# Patient Record
Sex: Female | Born: 1965
Health system: Southern US, Community
[De-identification: ages and names within clinical notes are randomized; demographics above are authoritative.]

## PROBLEM LIST (undated history)

## (undated) DIAGNOSIS — M51379 Other intervertebral disc degeneration, lumbosacral region without mention of lumbar back pain or lower extremity pain: Secondary | ICD-10-CM

## (undated) DIAGNOSIS — M1990 Unspecified osteoarthritis, unspecified site: Secondary | ICD-10-CM

## (undated) DIAGNOSIS — M5134 Other intervertebral disc degeneration, thoracic region: Secondary | ICD-10-CM

## (undated) DIAGNOSIS — M5137 Other intervertebral disc degeneration, lumbosacral region: Secondary | ICD-10-CM

## (undated) DIAGNOSIS — M549 Dorsalgia, unspecified: Secondary | ICD-10-CM

## (undated) DIAGNOSIS — C801 Malignant (primary) neoplasm, unspecified: Secondary | ICD-10-CM

## (undated) DIAGNOSIS — M5135 Other intervertebral disc degeneration, thoracolumbar region: Secondary | ICD-10-CM

## (undated) HISTORY — PX: TUBAL LIGATION: SHX77

## (undated) HISTORY — PX: CHOLECYSTECTOMY: SHX55

## (undated) HISTORY — PX: ABLATION: SHX5711

---

## 2013-07-08 DIAGNOSIS — R109 Unspecified abdominal pain: Secondary | ICD-10-CM | POA: Insufficient documentation

## 2013-07-12 DIAGNOSIS — K8020 Calculus of gallbladder without cholecystitis without obstruction: Secondary | ICD-10-CM | POA: Insufficient documentation

## 2013-08-06 DIAGNOSIS — F17210 Nicotine dependence, cigarettes, uncomplicated: Secondary | ICD-10-CM | POA: Insufficient documentation

## 2015-01-02 DIAGNOSIS — F4324 Adjustment disorder with disturbance of conduct: Secondary | ICD-10-CM | POA: Insufficient documentation

## 2017-03-28 ENCOUNTER — Encounter (HOSPITAL_BASED_OUTPATIENT_CLINIC_OR_DEPARTMENT_OTHER): Payer: Self-pay

## 2017-03-28 ENCOUNTER — Emergency Department (HOSPITAL_BASED_OUTPATIENT_CLINIC_OR_DEPARTMENT_OTHER)
Admission: EM | Admit: 2017-03-28 | Discharge: 2017-03-29 | Disposition: A | Discharge status: Home / Self Care | Payer: Self-pay | Attending: Emergency Medicine | Admitting: Emergency Medicine

## 2017-03-28 DIAGNOSIS — M549 Dorsalgia, unspecified: Secondary | ICD-10-CM | POA: Insufficient documentation

## 2017-03-28 DIAGNOSIS — Z5321 Procedure and treatment not carried out due to patient leaving prior to being seen by health care provider: Secondary | ICD-10-CM | POA: Insufficient documentation

## 2017-03-28 HISTORY — DX: Other intervertebral disc degeneration, thoracolumbar region: M51.35

## 2017-03-28 HISTORY — DX: Other intervertebral disc degeneration, lumbosacral region without mention of lumbar back pain or lower extremity pain: M51.379

## 2017-03-28 HISTORY — DX: Other intervertebral disc degeneration, lumbosacral region: M51.37

## 2017-03-28 HISTORY — DX: Other intervertebral disc degeneration, thoracic region: M51.34

## 2017-03-28 NOTE — ED Triage Notes (Signed)
C/o chronic pain to "whole back and both hips"-pain worse today-denies new injury-slow gait-grimacing

## 2017-03-29 ENCOUNTER — Emergency Department (HOSPITAL_BASED_OUTPATIENT_CLINIC_OR_DEPARTMENT_OTHER)
Admission: EM | Admit: 2017-03-29 | Discharge: 2017-03-29 | Disposition: A | Discharge status: Home / Self Care | Payer: Self-pay | Attending: Emergency Medicine | Admitting: Emergency Medicine

## 2017-03-29 ENCOUNTER — Encounter (HOSPITAL_BASED_OUTPATIENT_CLINIC_OR_DEPARTMENT_OTHER): Payer: Self-pay | Admitting: Emergency Medicine

## 2017-03-29 DIAGNOSIS — R8279 Other abnormal findings on microbiological examination of urine: Secondary | ICD-10-CM | POA: Insufficient documentation

## 2017-03-29 DIAGNOSIS — F17200 Nicotine dependence, unspecified, uncomplicated: Secondary | ICD-10-CM | POA: Insufficient documentation

## 2017-03-29 DIAGNOSIS — Y9389 Activity, other specified: Secondary | ICD-10-CM | POA: Insufficient documentation

## 2017-03-29 DIAGNOSIS — S39012A Strain of muscle, fascia and tendon of lower back, initial encounter: Secondary | ICD-10-CM | POA: Insufficient documentation

## 2017-03-29 DIAGNOSIS — Y929 Unspecified place or not applicable: Secondary | ICD-10-CM | POA: Insufficient documentation

## 2017-03-29 DIAGNOSIS — Y999 Unspecified external cause status: Secondary | ICD-10-CM | POA: Insufficient documentation

## 2017-03-29 DIAGNOSIS — M5432 Sciatica, left side: Secondary | ICD-10-CM

## 2017-03-29 DIAGNOSIS — X500XXA Overexertion from strenuous movement or load, initial encounter: Secondary | ICD-10-CM | POA: Insufficient documentation

## 2017-03-29 DIAGNOSIS — Z79899 Other long term (current) drug therapy: Secondary | ICD-10-CM | POA: Insufficient documentation

## 2017-03-29 LAB — URINALYSIS, MICROSCOPIC (REFLEX)

## 2017-03-29 LAB — URINALYSIS, ROUTINE W REFLEX MICROSCOPIC
Bilirubin Urine: NEGATIVE
Glucose, UA: NEGATIVE mg/dL
Hgb urine dipstick: NEGATIVE
KETONES UR: NEGATIVE mg/dL
NITRITE: POSITIVE — AB
Protein, ur: NEGATIVE mg/dL
SPECIFIC GRAVITY, URINE: 1.02 (ref 1.005–1.030)
pH: 6 (ref 5.0–8.0)

## 2017-03-29 MED ORDER — IBUPROFEN 800 MG PO TABS: 800.0000 mg | ORAL_TABLET | Freq: Three times a day (TID) | ORAL | 0 refills | Status: AC

## 2017-03-29 MED ORDER — KETOROLAC TROMETHAMINE 30 MG/ML IJ SOLN
30.0000 mg | Freq: Once | INTRAMUSCULAR | Status: AC
  Administered 2017-03-29: 30 mg via INTRAMUSCULAR
  Filled 2017-03-29: qty 1

## 2017-03-29 MED ORDER — CYCLOBENZAPRINE HCL 10 MG PO TABS: 10.0000 mg | ORAL_TABLET | Freq: Three times a day (TID) | ORAL | 0 refills | Status: DC | PRN

## 2017-03-29 NOTE — ED Triage Notes (Signed)
Patient states that  She is having pain to her left hip and down her left leg. Has a hx of DDD - reports that she is taking Motrin currently for the pain

## 2017-03-29 NOTE — ED Provider Notes (Signed)
[facility] Provider [facility]   CSN: [id number] Arrival date & time: 03/29/17  1552     History   Chief Complaint Chief Complaint  Patient presents with  . Back Pain    HPI Erika Lopez is a 51 y.o. female.  51 year old female with past medical history including thoracolumbar degenerative disc disease who presents with back and left leg pain. 4 days ago, the patient was with an elderly friend who started to fall and she tried to catch her. Since then, she has been having left-sided low back pain with radiation of the pain down her left leg. She has a history of low back pain but has been doing well recently. She states that she takes Black seed oil to prevent inflammation. Her pain is currently severe and worse with movement. She denies any leg weakness or numbness, saddle anesthesia, or bowel/bladder incontinence. No fevers, vomiting, or recent illness. No urinary symptoms. She took 2 doses of Motrin yesterday and one dose this morning. She denies any IV drug use.   The history is provided by the patient.  Back Pain      Past Medical History:  Diagnosis Date  . DDD (degenerative disc disease), lumbosacral   . DDD (degenerative disc disease), thoracic   . DDD (degenerative disc disease), thoracolumbar     There are no active problems to display for this patient.   Past Surgical History:  Procedure Laterality Date  . CHOLECYSTECTOMY      OB History    No data available       Home Medications    Prior to Admission medications   Medication Sig Start Date End Date Taking? Authorizing Provider  cyclobenzaprine (FLEXERIL) 10 MG tablet Take 1 tablet (10 mg total) by mouth 3 (three) times daily as needed for muscle spasms. 03/29/17   Little, Ambrose Finland, MD  ibuprofen (ADVIL,MOTRIN) 800 MG tablet Take 1 tablet (800 mg total) by mouth 3 (three) times daily. 03/29/17 04/02/17  Little, Ambrose Finland, MD    Family History History reviewed. No pertinent family  history.  Social History Social History  Substance Use Topics  . Smoking status: Current Every Day Smoker  . Smokeless tobacco: Never Used  . Alcohol use Yes     Comment: occ     Allergies   Patient has no known allergies.   Review of Systems Review of Systems  Musculoskeletal: Positive for back pain.   All other systems reviewed and are negative except that which was mentioned in HPI   Physical Exam Updated Vital Signs BP (!) 143/74 (BP Location: Right Arm)   Pulse 79   Temp 98.4 F (36.9 C) (Oral)   Resp 18   Ht 5\' 9"  (1.753 m)   Wt 84.4 kg (186 lb)   SpO2 100%   BMI 27.47 kg/m   Physical Exam  Constitutional: She is oriented to person, place, and time. She appears well-developed and well-nourished. No distress.  Uncomfortable  HENT:  Head: Normocephalic and atraumatic.  Moist mucous membranes  Eyes: Pupils are equal, round, and reactive to light. Conjunctivae are normal.  Neck: Neck supple.  Cardiovascular: Normal rate, regular rhythm and normal heart sounds.   No murmur heard. Pulmonary/Chest: Effort normal and breath sounds normal.  Abdominal: Soft. Bowel sounds are normal. She exhibits no distension. There is no tenderness.  Musculoskeletal: She exhibits no edema.  Pain with left straight leg raise  Neurological: She is alert and oriented to person, place, and time. She displays  normal reflexes. No sensory deficit. She exhibits normal muscle tone.  Fluent speech 5/5 strength x all 4 ext  Skin: Skin is warm and dry.  Psychiatric: She has a normal mood and affect. Judgment normal.  Nursing note and vitals reviewed.    ED Treatments / Results  Labs (all labs ordered are listed, but only abnormal results are displayed) Labs Reviewed  URINALYSIS, ROUTINE W REFLEX MICROSCOPIC - Abnormal; Notable for the following:       Result Value   APPearance CLOUDY (*)    Nitrite POSITIVE (*)    Leukocytes, UA MODERATE (*)    All other components within normal  limits  URINALYSIS, MICROSCOPIC (REFLEX) - Abnormal; Notable for the following:    Bacteria, UA MANY (*)    Squamous Epithelial / LPF 6-30 (*)    All other components within normal limits  URINE CULTURE    EKG  EKG Interpretation None       Radiology No results found.  Procedures Procedures (including critical care time)  Medications Ordered in ED Medications  ketorolac (TORADOL) 30 MG/ML injection 30 mg (30 mg Intramuscular Given 03/29/17 1721)     Initial Impression / Assessment and Plan / ED Course  I have reviewed the triage vital signs and the nursing notes.  Pertinent labs that were available during my care of the patient were reviewed by me and considered in my medical decision making (see chart for details).     Pt w/ LBP radiating down L leg since straining her back while catching a friend several days ago. No infectious symptoms and reassuring vital signs. No weakness or numbness on exam. A UA was sent from triage that contained bacteria, was nitrite positive. The patient states that her urine is always abnormal when she takes black seed oil and she denies any urinary symptoms whatsoever. She preferred sending culture but not treating w/ abx as she has no symptoms and no h/o diabetes or complicated UTI. I have discussed supportive measures including NSAIDs, muscle relaxants, heat therapy, and early range of motion. She has no signs or symptoms of cauda equina, no infectious symptoms to suggest discitis, and no neurologic deficits to warrant any imaging at this time. I have extensively reviewed return precautions with her. Provided with sports medicine follow-up if her symptoms do not improve with conservative measures. Gave IM Toradol prior to discharge and patient discharged in satisfactory condition.  Final Clinical Impressions(s) / ED Diagnoses   Final diagnoses:  Strain of lumbar region, initial encounter  Sciatica of left side    New Prescriptions Discharge  Medication List as of 03/29/2017  5:12 PM    START taking these medications   Details  cyclobenzaprine (FLEXERIL) 10 MG tablet Take 1 tablet (10 mg total) by mouth 3 (three) times daily as needed for muscle spasms., Starting Wed 03/29/2017, Print    ibuprofen (ADVIL,MOTRIN) 800 MG tablet Take 1 tablet (800 mg total) by mouth 3 (three) times daily., Starting Wed 03/29/2017, Until Sun 04/02/2017, Print         Little, Ambrose Finlandachel Morgan, MD 03/29/17 [phone]

## 2017-04-01 LAB — URINE CULTURE
Culture: >=100000 — AB
SPECIAL REQUESTS: NORMAL

## 2017-04-02 ENCOUNTER — Telehealth: Payer: Self-pay | Admitting: *Deleted

## 2017-04-02 NOTE — Progress Notes (Signed)
ED Antimicrobial Stewardship Positive Culture Follow Up   Erika MochaDeanna Mazurek is an 51 y.o. female who [facility] with a chief complaint of  Chief Complaint  Patient presents with  . Back Pain    Recent Results (from the past 720 hour(s))  Urine culture     Status: Abnormal   Collection Time: 03/29/17  4:15 PM  Result Value Ref Range Status   Specimen Description URINE, CLEAN CATCH  Final   Special Requests Normal  Final   Culture >=100,000 COLONIES/mL CITROBACTER KOSERI (A)  Final   Report Status 04/01/2017 FINAL  Final   Organism ID, Bacteria CITROBACTER KOSERI (A)  Final      Susceptibility   Citrobacter koseri - MIC*    CEFAZOLIN <=4 SENSITIVE Sensitive     CEFTRIAXONE <=1 SENSITIVE Sensitive     CIPROFLOXACIN <=0.25 SENSITIVE Sensitive     GENTAMICIN <=1 SENSITIVE Sensitive     IMIPENEM <=0.25 SENSITIVE Sensitive     NITROFURANTOIN 32 SENSITIVE Sensitive     TRIMETH/SULFA <=20 SENSITIVE Sensitive     PIP/TAZO <=4 SENSITIVE Sensitive     * >=100,000 COLONIES/mL CITROBACTER KOSERI    []  Treated with N/A, organism resistant to prescribed antimicrobial [x]  Patient discharged originally without antimicrobial agent and treatment is now indicated  New antibiotic prescription: IF symptomatic, start cephalexin 500mg  PO BID x 7 days  ED Provider: Michela PitcherMina Fawze, PA   Eletha Culbertson, Drake Leachachel Lynn 04/02/2017, 10:34 AM Clinical Pharmacist Phone# [phone]

## 2017-04-02 NOTE — Telephone Encounter (Signed)
Post ED Visit - Positive Culture Follow-up: Unsuccessful Patient Follow-up  Culture assessed and recommendations reviewed by:  []  Enzo BiNathan Batchelder, Pharm.D. []  Celedonio MiyamotoJeremy Frens, Pharm.D., BCPS AQ-ID []  Garvin FilaMike Maccia, Pharm.D., BCPS []  Georgina PillionElizabeth Martin, [address]Ph[address]D., BCPS []  [place] RanchMinh Pham, [address]Pharm[address]D., BCPS, AAHIVP []  Estella HuskMichelle Turner, Pharm.D., BCPS, AAHIVP []  Lysle Pearlachel Rumbarger, PharmD, BCPS []  Casilda Carlsaylor Stone, PharmD, BCPS []  Pollyann SamplesAndy Johnston, PharmD, BCPS  Positive urine culture, reviewed by Michela PitcherMina Fawze, PA-C  [x]  Patient discharged without antimicrobial prescription and treatment is now indicated if symptomatic []  Organism is resistant to prescribed ED discharge antimicrobial []  Patient with positive blood cultures   Unable to contact patient after 3 attempts, letter will be sent to address on file  Lysle PearlRobertson, Gabrielly Mccrystal Talley 04/02/2017, 11:00 AM

## 2017-06-19 ENCOUNTER — Telehealth: Payer: Self-pay | Admitting: Emergency Medicine

## 2017-06-19 NOTE — Telephone Encounter (Signed)
Lost to followup 

## 2017-09-16 ENCOUNTER — Other Ambulatory Visit: Payer: Self-pay

## 2017-09-16 ENCOUNTER — Encounter (HOSPITAL_BASED_OUTPATIENT_CLINIC_OR_DEPARTMENT_OTHER): Payer: Self-pay | Admitting: *Deleted

## 2017-09-16 DIAGNOSIS — K0889 Other specified disorders of teeth and supporting structures: Secondary | ICD-10-CM | POA: Insufficient documentation

## 2017-09-16 DIAGNOSIS — Z5321 Procedure and treatment not carried out due to patient leaving prior to being seen by health care provider: Secondary | ICD-10-CM | POA: Insufficient documentation

## 2017-09-16 NOTE — ED Triage Notes (Signed)
Right upper dental pain x 1 day 

## 2017-09-17 ENCOUNTER — Emergency Department (HOSPITAL_BASED_OUTPATIENT_CLINIC_OR_DEPARTMENT_OTHER)
Admission: EM | Admit: 2017-09-17 | Discharge: 2017-09-17 | Disposition: A | Discharge status: Home / Self Care | Payer: Self-pay | Attending: Emergency Medicine | Admitting: Emergency Medicine

## 2017-09-17 NOTE — ED Notes (Signed)
Registration reports pt seen leaving department

## 2017-09-18 ENCOUNTER — Other Ambulatory Visit: Payer: Self-pay

## 2017-09-18 ENCOUNTER — Emergency Department (HOSPITAL_BASED_OUTPATIENT_CLINIC_OR_DEPARTMENT_OTHER)
Admission: EM | Admit: 2017-09-18 | Discharge: 2017-09-18 | Disposition: A | Discharge status: Home / Self Care | Payer: Self-pay | Attending: Physician Assistant | Admitting: Physician Assistant

## 2017-09-18 ENCOUNTER — Encounter (HOSPITAL_BASED_OUTPATIENT_CLINIC_OR_DEPARTMENT_OTHER): Payer: Self-pay | Admitting: *Deleted

## 2017-09-18 DIAGNOSIS — Z9049 Acquired absence of other specified parts of digestive tract: Secondary | ICD-10-CM | POA: Insufficient documentation

## 2017-09-18 DIAGNOSIS — K047 Periapical abscess without sinus: Secondary | ICD-10-CM | POA: Insufficient documentation

## 2017-09-18 DIAGNOSIS — F17200 Nicotine dependence, unspecified, uncomplicated: Secondary | ICD-10-CM | POA: Insufficient documentation

## 2017-09-18 MED ORDER — CLINDAMYCIN HCL 150 MG PO CAPS
300.0000 mg | ORAL_CAPSULE | Freq: Once | ORAL | Status: AC
  Administered 2017-09-18: 300 mg via ORAL
  Filled 2017-09-18: qty 2

## 2017-09-18 MED ORDER — CLINDAMYCIN HCL 300 MG PO CAPS: 300.0000 mg | ORAL_CAPSULE | Freq: Four times a day (QID) | ORAL | 0 refills | Status: AC

## 2017-09-18 MED FILL — CLINDAMYCIN HCL 300 MG CAPS: 300 | 10 days supply | Qty: 40 | Fill #0

## 2017-09-18 NOTE — ED Notes (Signed)
ED Provider at bedside. 

## 2017-09-18 NOTE — ED Provider Notes (Signed)
[facility] EMERGENCY DEPARTMENT Provider Note   CSN: [id number] Arrival date & time: 09/18/17  1222     History   Chief Complaint Chief Complaint  Patient presents with  . Dental Pain    HPI Erika Lopez is a 52 y.o. female.  HPI   52 year old presenting with dental pain.  Patient says this happened over a couple days.  She has swelling to the right cheek.  Patient had this happen previously.  Patient has multiple dental caries/broken teeth.  Past Medical History:  Diagnosis Date  . DDD (degenerative disc disease), lumbosacral   . DDD (degenerative disc disease), thoracic   . DDD (degenerative disc disease), thoracolumbar     There are no active problems to display for this patient.   Past Surgical History:  Procedure Laterality Date  . CHOLECYSTECTOMY      OB History    No data available       Home Medications    Prior to Admission medications   Medication Sig Start Date End Date Taking? Authorizing Provider  cyclobenzaprine (FLEXERIL) 10 MG tablet Take 1 tablet (10 mg total) by mouth 3 (three) times daily as needed for muscle spasms. 03/29/17   Little, Ambrose Finlandachel Morgan, MD    Family History No family history on file.  Social History Social History   Tobacco Use  . Smoking status: Current Every Day Smoker  . Smokeless tobacco: Never Used  Substance Use Topics  . Alcohol use: Yes    Comment: occ  . Drug use: No     Allergies   Patient has no known allergies.   Review of Systems Review of Systems  Constitutional: Negative for fever.  HENT: Positive for dental problem.      Physical Exam Updated Vital Signs BP 134/80   Pulse 86   Temp 98.3 F (36.8 C) (Oral)   Resp 20   Ht 5\' 9"  (1.753 m)   Wt 83.9 kg (185 lb)   SpO2 99%   BMI 27.32 kg/m   Physical Exam  Constitutional: She is oriented to person, place, and time. She appears well-developed and well-nourished.  HENT:  Head: Normocephalic and atraumatic.  Multiple broken  teeth.  Multiple broken dental caries.  Patient has tenderness swelling to the right upper gum.  Small area nonfluctuant indurated.  Eyes: Right eye exhibits no discharge. Left eye exhibits no discharge.  Cardiovascular: Normal rate and regular rhythm.  No murmur heard. Pulmonary/Chest: Effort normal and breath sounds normal. She has no rales.  Neurological: She is oriented to person, place, and time.  Skin: Skin is warm and dry. She is not diaphoretic.  Psychiatric: She has a normal mood and affect.  Nursing note and vitals reviewed.    ED Treatments / Results  Labs (all labs ordered are listed, but only abnormal results are displayed) Labs Reviewed - No data to display  EKG  EKG Interpretation None       Radiology No results found.  Procedures Procedures (including critical care time)  Medications Ordered in ED Medications  clindamycin (CLEOCIN) capsule 300 mg (not administered)     Initial Impression / Assessment and Plan / ED Course  I have reviewed the triage vital signs and the nursing notes.  Pertinent labs & imaging results that were available during my care of the patient were reviewed by me and considered in my medical decision making (see chart for details).     52 year old here with dental infection.  Will treat with clindamycin.  Gave her dental resources for follow-up.  Do not suspect any kind of larger infection, patient appears nontoxic, no appreciable external swelling on exam.   Final Clinical Impressions(s) / ED Diagnoses   Final diagnoses:  None    ED Discharge Orders    None       Landa Mullinax, Cindee Salt, MD 09/18/17 1423

## 2017-09-18 NOTE — Discharge Instructions (Signed)
Please use this antibiotics and follow-up with a dentist.  We have provided you a list to help you get started.

## 2017-09-18 NOTE — ED Triage Notes (Signed)
Dental pain x 3 days.  

## 2019-09-27 ENCOUNTER — Emergency Department (HOSPITAL_BASED_OUTPATIENT_CLINIC_OR_DEPARTMENT_OTHER): Payer: Self-pay

## 2019-09-27 ENCOUNTER — Other Ambulatory Visit: Payer: Self-pay

## 2019-09-27 ENCOUNTER — Emergency Department (HOSPITAL_BASED_OUTPATIENT_CLINIC_OR_DEPARTMENT_OTHER)
Admission: EM | Admit: 2019-09-27 | Discharge: 2019-09-27 | Disposition: A | Discharge status: Home / Self Care | Payer: Self-pay | Attending: Emergency Medicine | Admitting: Emergency Medicine

## 2019-09-27 ENCOUNTER — Encounter (HOSPITAL_BASED_OUTPATIENT_CLINIC_OR_DEPARTMENT_OTHER): Payer: Self-pay | Admitting: Emergency Medicine

## 2019-09-27 DIAGNOSIS — M62830 Muscle spasm of back: Secondary | ICD-10-CM | POA: Insufficient documentation

## 2019-09-27 DIAGNOSIS — W19XXXA Unspecified fall, initial encounter: Secondary | ICD-10-CM

## 2019-09-27 DIAGNOSIS — M5416 Radiculopathy, lumbar region: Secondary | ICD-10-CM | POA: Insufficient documentation

## 2019-09-27 DIAGNOSIS — F17210 Nicotine dependence, cigarettes, uncomplicated: Secondary | ICD-10-CM | POA: Insufficient documentation

## 2019-09-27 MED ORDER — DIAZEPAM 5 MG PO TABS: 5.0000 mg | ORAL_TABLET | Freq: Two times a day (BID) | ORAL | 0 refills | Status: DC

## 2019-09-27 MED ORDER — HYDROCODONE-ACETAMINOPHEN 5-325 MG PO TABS: 1.0000 | ORAL_TABLET | ORAL | 0 refills | Status: DC | PRN

## 2019-09-27 MED ORDER — DEXAMETHASONE SODIUM PHOSPHATE 10 MG/ML IJ SOLN
10.0000 mg | Freq: Once | INTRAMUSCULAR | Status: AC
  Administered 2019-09-27: 10 mg via INTRAMUSCULAR
  Filled 2019-09-27: qty 1

## 2019-09-27 MED ORDER — KETOROLAC TROMETHAMINE 30 MG/ML IJ SOLN
30.0000 mg | Freq: Once | INTRAMUSCULAR | Status: AC
  Administered 2019-09-27: 08:00:00 30 mg via INTRAMUSCULAR
  Filled 2019-09-27: qty 1

## 2019-09-27 MED ORDER — PREDNISONE 10 MG (21) PO TBPK: ORAL_TABLET | ORAL | 0 refills | Status: DC

## 2019-09-27 MED ORDER — IBUPROFEN 600 MG PO TABS: 600.0000 mg | ORAL_TABLET | Freq: Four times a day (QID) | ORAL | 0 refills | Status: DC | PRN

## 2019-09-27 MED FILL — predniSONE 10 MG TABS: 10 | 12 days supply | Qty: 42 | Fill #0

## 2019-09-27 MED FILL — IBUPROFEN 600 MG TABLET: 600 | 7 days supply | Qty: 30 | Fill #0

## 2019-09-27 MED FILL — HYDROCODON-APAP 5-325: 5-325 | 2 days supply | Qty: 10 | Fill #0

## 2019-09-27 MED FILL — diazePAM 5 MG TABS: 5 | 5 days supply | Qty: 10 | Fill #0

## 2019-09-27 NOTE — ED Notes (Signed)
ED Provider at bedside. 

## 2019-09-27 NOTE — ED Triage Notes (Signed)
Slipped and fell at work 2 days ago.  Pain from upper to lower back on the left mostly.  Pain radiates down into left hip and down left leg. Pt drove to ED.

## 2019-09-27 NOTE — ED Provider Notes (Signed)
[facility] EMERGENCY DEPARTMENT Provider Note   CSN: [id number] Arrival date & time: 09/27/19  3893     History Chief Complaint  Patient presents with  . Fall    Erika Lopez is a 54 y.o. female.  Pt presents to the ED today after a fall at work 2 days ago.  She works at [employer] and slipped on [employer] paper that the "price marked as" stickers are on.  She landed on her bottom and has pain in her back which radiates into her left leg.  She tried to go to work today, but was unable to work.  She was able to drive here.  She has not had any bowel or bladder issues.         Past Medical History:  Diagnosis Date  . DDD (degenerative disc disease), lumbosacral   . DDD (degenerative disc disease), thoracic   . DDD (degenerative disc disease), thoracolumbar     There are no problems to display for this patient.   Past Surgical History:  Procedure Laterality Date  . CHOLECYSTECTOMY       OB History   No obstetric history on file.     No family history on file.  Social History   Tobacco Use  . Smoking status: Current Every Day Smoker    Packs/day: 0.50    Types: Cigarettes  . Smokeless tobacco: Never Used  Substance Use Topics  . Alcohol use: Yes    Comment: occ  . Drug use: No    Home Medications Prior to Admission medications   Medication Sig Start Date End Date Taking? Authorizing Provider  diazepam (VALIUM) 5 MG tablet Take 1 tablet (5 mg total) by mouth 2 (two) times daily. 09/27/19   Jacalyn Lefevre, MD  HYDROcodone-acetaminophen (NORCO/VICODIN) 5-325 MG tablet Take 1 tablet by mouth every 4 (four) hours as needed. 09/27/19   Jacalyn Lefevre, MD  ibuprofen (ADVIL) 600 MG tablet Take 1 tablet (600 mg total) by mouth every 6 (six) hours as needed. 09/27/19   Jacalyn Lefevre, MD  predniSONE (STERAPRED UNI-PAK 21 TAB) 10 MG (21) TBPK tablet Take 6 tabs for 2 days, then 5 for 2 days, then 4 for 2 days, then 3 for 2 days, 2 for 2 days, then 1 for 2 days  09/27/19   Jacalyn Lefevre, MD    Allergies    Patient has no known allergies.  Review of Systems   Review of Systems  Musculoskeletal: Positive for back pain.  All other systems reviewed and are negative.   Physical Exam Updated Vital Signs BP (!) 152/79 (BP Location: Right Arm)   Pulse 91   Resp 16   Ht 5' 8.5" (1.74 m)   Wt 81.1 kg   SpO2 100%   BMI 26.79 kg/m   Physical Exam Vitals and nursing note reviewed.  Constitutional:      Appearance: Normal appearance.  HENT:     Head: Normocephalic and atraumatic.     Right Ear: External ear normal.     Left Ear: External ear normal.     Nose: Nose normal.     Mouth/Throat:     Mouth: Mucous membranes are moist.     Pharynx: Oropharynx is clear.  Eyes:     Extraocular Movements: Extraocular movements intact.     Conjunctiva/sclera: Conjunctivae normal.     Pupils: Pupils are equal, round, and reactive to light.  Cardiovascular:     Rate and Rhythm: Normal rate and regular rhythm.  Pulses: Normal pulses.     Heart sounds: Normal heart sounds.  Pulmonary:     Effort: Pulmonary effort is normal.     Breath sounds: Normal breath sounds.  Abdominal:     General: Abdomen is flat. Bowel sounds are normal.     Palpations: Abdomen is soft.  Musculoskeletal:     Cervical back: Normal range of motion and neck supple.       Back:  Skin:    General: Skin is warm.     Capillary Refill: Capillary refill takes less than 2 seconds.  Neurological:     General: No focal deficit present.     Mental Status: She is alert and oriented to person, place, and time.  Psychiatric:        Mood and Affect: Mood normal.        Behavior: Behavior normal.        Thought Content: Thought content normal.        Judgment: Judgment normal.     ED Results / Procedures / Treatments   Labs (all labs ordered are listed, but only abnormal results are displayed) Labs Reviewed - No data to display  EKG None  Radiology DG Lumbar Spine  Complete  Result Date: 09/27/2019 CLINICAL DATA:  Low back pain and left lower extremity radiculopathy since a fall 2 days ago. EXAM: LUMBAR SPINE - COMPLETE 4+ VIEW COMPARISON:  Radiographs dated 02/10/2015 FINDINGS: There is no fracture or bone destruction. Alignment is normal. Slight narrowing of the L5-S1 disc space. Slight facet arthritis at L4-5 on the right, new since the prior study. Sacroiliac joints appear normal. IMPRESSION: No acute abnormality. Slight degenerative disc disease at L5-S1. New slight facet arthritis at L4-5 on the right. Electronically Signed   By: Lorriane Shire M.D.   On: 09/27/2019 08:43   DG Hip Unilat W or Wo Pelvis 2-3 Views Left  Result Date: 09/27/2019 CLINICAL DATA:  Low back pain and left lower extremity radiculopathy since a fall 2 days ago. EXAM: DG HIP (WITH OR WITHOUT PELVIS) 2-3V LEFT COMPARISON:  None. FINDINGS: There is no free pelvic fracture or left hip fracture. Tiny osteophyte on the left femoral head. No joint space narrowing. Soft tissues are normal. IMPRESSION: Minimal degenerative changes of the left hip.  No acute abnormality. Electronically Signed   By: Lorriane Shire M.D.   On: 09/27/2019 08:45    Procedures Procedures (including critical care time)  Medications Ordered in ED Medications  ketorolac (TORADOL) 30 MG/ML injection 30 mg (30 mg Intramuscular Given 09/27/19 0737)  dexamethasone (DECADRON) injection 10 mg (10 mg Intramuscular Given 09/27/19 0735)    ED Course  I have reviewed the triage vital signs and the nursing notes.  Pertinent labs & imaging results that were available during my care of the patient were reviewed by me and considered in my medical decision making (see chart for details).    MDM Rules/Calculators/A&P                      Pain has improved, but it is still there.  She drove, so she can't have more meds here.  She will be given rx for pain meds and a note for work.  Return if worse. Final Clinical Impression(s) /  ED Diagnoses Final diagnoses:  Lumbar radiculopathy  Lumbar paraspinal muscle spasm  Fall, initial encounter    Rx / DC Orders ED Discharge Orders         Ordered  predniSONE (STERAPRED UNI-PAK 21 TAB) 10 MG (21) TBPK tablet     09/27/19 0825    HYDROcodone-acetaminophen (NORCO/VICODIN) 5-325 MG tablet  Every 4 hours PRN     09/27/19 0825    diazepam (VALIUM) 5 MG tablet  2 times daily     09/27/19 0825    ibuprofen (ADVIL) 600 MG tablet  Every 6 hours PRN     09/27/19 0825           Jacalyn Lefevre, MD 09/27/19 [phone]

## 2019-11-12 ENCOUNTER — Emergency Department (HOSPITAL_BASED_OUTPATIENT_CLINIC_OR_DEPARTMENT_OTHER)
Admission: EM | Admit: 2019-11-12 | Discharge: 2019-11-12 | Disposition: A | Discharge status: Home / Self Care | Payer: Self-pay | Attending: Emergency Medicine | Admitting: Emergency Medicine

## 2019-11-12 ENCOUNTER — Other Ambulatory Visit: Payer: Self-pay

## 2019-11-12 ENCOUNTER — Encounter (HOSPITAL_BASED_OUTPATIENT_CLINIC_OR_DEPARTMENT_OTHER): Payer: Self-pay | Admitting: Emergency Medicine

## 2019-11-12 DIAGNOSIS — M5442 Lumbago with sciatica, left side: Secondary | ICD-10-CM | POA: Insufficient documentation

## 2019-11-12 DIAGNOSIS — G8929 Other chronic pain: Secondary | ICD-10-CM

## 2019-11-12 DIAGNOSIS — F17210 Nicotine dependence, cigarettes, uncomplicated: Secondary | ICD-10-CM | POA: Insufficient documentation

## 2019-11-12 DIAGNOSIS — M545 Low back pain: Secondary | ICD-10-CM | POA: Diagnosis present

## 2019-11-12 MED ORDER — LIDOCAINE 5 % EX PTCH: 1.0000 | MEDICATED_PATCH | CUTANEOUS | 0 refills | Status: DC

## 2019-11-12 MED ORDER — KETOROLAC TROMETHAMINE 30 MG/ML IJ SOLN
30.0000 mg | Freq: Once | INTRAMUSCULAR | Status: AC
  Administered 2019-11-12: 30 mg via INTRAMUSCULAR
  Filled 2019-11-12: qty 1

## 2019-11-12 MED ORDER — MELOXICAM 15 MG PO TABS: 15.0000 mg | ORAL_TABLET | Freq: Every day | ORAL | 0 refills | Status: AC

## 2019-11-12 MED ORDER — KETOROLAC TROMETHAMINE 30 MG/ML IJ SOLN
30.0000 mg | Freq: Once | INTRAMUSCULAR | Status: DC
Start: 2019-11-12 — End: 2019-11-12

## 2019-11-12 MED FILL — LIDOCAINE PATCH 5%: 5 | 30 days supply | Qty: 30 | Fill #0

## 2019-11-12 MED FILL — MELOXICAM 15 MG TABLET: 15 | 15 days supply | Qty: 15 | Fill #0

## 2019-11-12 NOTE — ED Triage Notes (Signed)
Left low back pain that radiates down into left leg.  This was a WC injury that happened in March and is no better.

## 2019-11-12 NOTE — ED Notes (Signed)
Pt discharged to home. Discharge instructions have been discussed with patient and/or family members. Pt verbally acknowledges understanding d/c instructions, and endorses comprehension to checkout at registration before leaving.  °

## 2019-11-12 NOTE — Discharge Instructions (Signed)
Take the medications as prescribed.   Follow up with Neurosurgery outpatient.  Return if you develop bowel or bladder incontinence, numbness to perineal region or fever.

## 2019-11-12 NOTE — ED Provider Notes (Addendum)
[facility] EMERGENCY DEPARTMENT Provider Note   CSN: [id number] Arrival date & time: 11/12/19  1614    History Chief Complaint  Patient presents with  . Back Pain    Erika Lopez is a 54 y.o. 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 Has trip and fall at work on 09/27/19. Was seen here in ED at that time. Had negative xray back given steroids, Norco and toradol. Patient states continued pain to lower back which radiates into her left leg. Pain has not increased nor improved since the injury.  Initially anti-inflammatories and pain medicine helps.  She was given a sitting job at work which helped.  Patient states her employer has put her back to full duty which has increased her pain.  Pain worse when she stands for long periods of times, bends and twist.  No new injury.  No weakness paresthesias.  No IV drug use, bowel or bladder incontinence, saddle paresthesias.  Feels like her "typical" back pain she has had previously.  Denies additional aggravating relieving factors.  No fever, chills, nausea, vomiting, chest pain, shortness of breath, abdominal pain, diarrhea, dysuria.  Has history of chronic constipation which is at its baseline.  Rates her pain a 7/10. Ambulatory without difficulty.  She is requesting Neurosurgery or Ortho referral for her back pain.  History obtained from patient and past medical records.  No interpreter is used.  HPI     Past Medical History:  Diagnosis Date  . DDD (degenerative disc disease), lumbosacral   . DDD (degenerative disc disease), thoracic   . DDD (degenerative disc disease), thoracolumbar     There are no problems to display for this patient.   Past Surgical History:  Procedure Laterality Date  . CHOLECYSTECTOMY       OB History   No obstetric history on file.     No family history on file.  Social History   Tobacco Use  . Smoking status:  Current Every Day Smoker    Packs/day: 0.50    Types: Cigarettes  . Smokeless tobacco: Never Used  Substance Use Topics  . Alcohol use: Yes    Comment: occ  . Drug use: No    Home Medications Prior to Admission medications   Medication Sig Start Date End Date Taking? Authorizing Provider  lidocaine (LIDODERM) 5 % Place 1 patch onto the skin daily. Remove & Discard patch within 12 hours or as directed by MD 11/12/19   Quantavis Obryant A, PA-C  meloxicam (MOBIC) 15 MG tablet Take 1 tablet (15 mg total) by mouth daily for 15 days. 11/12/19 11/27/19  Pasquale Matters A, PA-C    Allergies    Patient has no known allergies.  Review of Systems   Review of Systems  Constitutional: Negative.   HENT: Negative.   Respiratory: Negative.   Cardiovascular: Negative.   Gastrointestinal: Negative.   Genitourinary: Negative.   Musculoskeletal: Positive for back pain. Negative for gait problem.  Skin: Negative.   Neurological: Negative.   All other systems reviewed and are negative.   Physical Exam Updated Vital Signs BP (!) 148/78 (BP Location: Right Arm)   Pulse 87   Temp 98.6 F (37 C) (Oral)   Resp 16   Ht 5\' 8"  (1.727 m)   Wt 82.6 kg   SpO2 99%   BMI 27.69 kg/m   Physical Exam Physical Exam  Constitutional: Pt appears well-developed and well-nourished.  No distress.  HENT:  Head: Normocephalic and atraumatic.  Mouth/Throat: Oropharynx is clear and moist. No oropharyngeal exudate.  Eyes: Conjunctivae are normal.  Neck: Normal range of motion. Neck supple.  Full ROM without pain  Cardiovascular: Normal rate, regular rhythm and intact distal pulses.   Pulmonary/Chest: Effort normal and breath sounds normal. No respiratory distress. Pt has no wheezes.  Abdominal: Soft. Pt exhibits no distension. There is no tenderness, rebound or guarding. No abd bruit or pulsatile mass Musculoskeletal:  Full range of motion of the T-spine and L-spine with flexion, hyperextension, and lateral  flexion. No midline tenderness or stepoffs. No tenderness to palpation of the spinous processes of the T-spine or L-spine. Moderate tenderness to palpation of the paraspinous muscles of the L-spine on LEFT. POsitive straight leg raise on left at 40'. Lymphadenopathy:    Pt has no cervical adenopathy.  Neurological: Pt is alert. Pt has normal reflexes.  Reflex Scores:      Bicep reflexes are 2+ on the right side and 2+ on the left side.      Brachioradialis reflexes are 2+ on the right side and 2+ on the left side.      Patellar reflexes are 2+ on the right side and 2+ on the left side.      Achilles reflexes are 2+ on the right side and 2+ on the left side. Speech is clear and goal oriented, follows commands Normal 5/5 strength in upper and lower extremities bilaterally including dorsiflexion and plantar flexion, strong and equal grip strength Sensation normal to light and sharp touch Moves extremities without ataxia, coordination intact Normal gait Normal balance No Clonus Skin: Skin is warm and dry. No rash noted or lesions noted. Pt is not diaphoretic. No erythema, ecchymosis,edema or warmth.  Psychiatric: Pt has a normal mood and affect. Behavior is normal.  Nursing note and vitals reviewed. ED Results / Procedures / Treatments   Labs (all labs ordered are listed, but only abnormal results are displayed) Labs Reviewed - No data to display  EKG None  Radiology No results found.  Procedures Procedures (including critical care time)  Medications Ordered in ED Medications  ketorolac (TORADOL) 30 MG/ML injection 30 mg (has no administration in time range)   ED Course  I have reviewed the triage vital signs and the nursing notes.  Pertinent labs & imaging results that were available during my care of the patient were reviewed by me and considered in my medical decision making (see chart for details).  54 year old presents for evaluation of back pain after trip and fall  almost 3 months ago. Afebrile, non septic, non ill appearing. Seen here at that time with negative lumbar xray and dc home with symptomatic management. Pain not worse however not improved since fall.  No red flags for back pain, specifically no history IV drug use, bowel or bladder incontinence, saddle paresthesias.  I have low suspicion for cauda equina, discitis, osteomyelitis, transverse myelitis, psoas abscess, burst or compression fracture.  She appears overall well.  Ambulatory without difficulty.  Patient does have history of recurrent lumbar back pain with known history of DDD with radicular symptoms to the left.  Exam seems consistent with her prior visits, per my review.  Do not think she needs emergent MRI or CT imaging at this time.  Will treat with anti-inflammatories, lidocaine patches and have her follow-up with neurosurgery  Give chronic back pain at this point. Do not think she needs narcotic prescription for her  pain currently.  The patient has been appropriately medically screened and/or stabilized in the ED. I have low suspicion for any other emergent medical condition which would require further screening, evaluation or treatment in the ED or require inpatient management.  Patient is hemodynamically stable and in no acute distress.  Patient able to ambulate in department prior to ED.  Evaluation does not show acute pathology that would require ongoing or additional emergent interventions while in the emergency department or further inpatient treatment.  I have discussed the diagnosis with the patient and answered all questions.  Pain is been managed while in the emergency department and patient has no further complaints prior to discharge.  Patient is comfortable with plan discussed in room and is stable for discharge at this time.  I have discussed strict return precautions for returning to the emergency department.  Patient was encouraged to follow-up with PCP/specialist refer to at  discharge.    MDM Rules/Calculators/A&P                      Final Clinical Impression(s) / ED Diagnoses Final diagnoses:  Chronic left-sided low back pain with left-sided sciatica    Rx / DC Orders ED Discharge Orders         Ordered    meloxicam (MOBIC) 15 MG tablet  Daily     11/12/19 1658    lidocaine (LIDODERM) 5 %  Every 24 hours     11/12/19 1658           Quintasia Theroux A, PA-C 11/12/19 1701    Antonela Freiman A, PA-C 11/12/19 1702    Little, Ambrose Finland, MD 11/12/19 1811

## 2019-11-25 DIAGNOSIS — M545 Low back pain, unspecified: Secondary | ICD-10-CM | POA: Insufficient documentation

## 2020-05-19 ENCOUNTER — Emergency Department (HOSPITAL_BASED_OUTPATIENT_CLINIC_OR_DEPARTMENT_OTHER)
Admission: EM | Admit: 2020-05-19 | Discharge: 2020-05-19 | Disposition: A | Discharge status: Home / Self Care | Payer: Self-pay | Attending: Emergency Medicine | Admitting: Emergency Medicine

## 2020-05-19 ENCOUNTER — Other Ambulatory Visit: Payer: Self-pay

## 2020-05-19 ENCOUNTER — Other Ambulatory Visit (HOSPITAL_BASED_OUTPATIENT_CLINIC_OR_DEPARTMENT_OTHER): Payer: Self-pay | Admitting: Emergency Medicine

## 2020-05-19 ENCOUNTER — Encounter (HOSPITAL_BASED_OUTPATIENT_CLINIC_OR_DEPARTMENT_OTHER): Payer: Self-pay | Admitting: Emergency Medicine

## 2020-05-19 DIAGNOSIS — H1032 Unspecified acute conjunctivitis, left eye: Secondary | ICD-10-CM | POA: Insufficient documentation

## 2020-05-19 DIAGNOSIS — F17210 Nicotine dependence, cigarettes, uncomplicated: Secondary | ICD-10-CM | POA: Insufficient documentation

## 2020-05-19 DIAGNOSIS — H109 Unspecified conjunctivitis: Secondary | ICD-10-CM

## 2020-05-19 MED ORDER — ERYTHROMYCIN 5 MG/GM OP OINT: TOPICAL_OINTMENT | OPHTHALMIC | 0 refills | Status: DC

## 2020-05-19 MED FILL — ERYTHROMYCIN EYE OINTMENT: 5 | 7 days supply | Qty: 4 | Fill #0

## 2020-05-19 NOTE — ED Triage Notes (Signed)
Reports left eye redness, pain and discharge since Friday.

## 2020-05-19 NOTE — Discharge Instructions (Signed)
Follow up with a family doc.  Return for change in vision, fever.  You can also follow up with an eye doctor.

## 2020-05-19 NOTE — ED Provider Notes (Signed)
[facility] EMERGENCY DEPARTMENT Provider Note   CSN: [id number] Arrival date & time: 05/19/20  1137     History Chief Complaint  Patient presents with  . Eye Drainage    Erika Lopez is a 54 y.o. female.  54 yo F with a chief complaints of left eye drainage and itching.  This been going on for about 3 to 4 days now.  No known sick contacts no cough congestion or fever.  She has been waking up in the morning with her eyes matted shut.  Having some thick yellowish-greenish discharge.  Denies contact lens use.  Denies trauma or foreign body.  Denies woodworking metal working, metal grinding.  The history is provided by the patient.  Eye Problem Location:  Left eye Quality:  Aching and tearing (itching) Severity:  Moderate Onset quality:  Gradual Duration:  4 days Timing:  Constant Progression:  Unchanged Chronicity:  New Relieved by:  Nothing Worsened by:  Nothing Ineffective treatments:  None tried Associated symptoms: discharge and redness   Associated symptoms: no headaches, no nausea and no vomiting        Past Medical History:  Diagnosis Date  . DDD (degenerative disc disease), lumbosacral   . DDD (degenerative disc disease), thoracic   . DDD (degenerative disc disease), thoracolumbar     There are no problems to display for this patient.   Past Surgical History:  Procedure Laterality Date  . CHOLECYSTECTOMY       OB History   No obstetric history on file.     No family history on file.  Social History   Tobacco Use  . Smoking status: Current Every Day Smoker    Packs/day: 0.50    Types: Cigarettes  . Smokeless tobacco: Never Used  Substance Use Topics  . Alcohol use: Yes    Comment: occ  . Drug use: No    Home Medications Prior to Admission medications   Medication Sig Start Date End Date Taking? Authorizing Provider  erythromycin ophthalmic ointment Place a 1/2 inch ribbon of ointment into the lower eyelid four times a day for  7 days. 05/19/20   Melene Plan, DO  lidocaine (LIDODERM) 5 % Place 1 patch onto the skin daily. Remove & Discard patch within 12 hours or as directed by MD 11/12/19   Henderly, Britni A, PA-C    Allergies    Patient has no known allergies.  Review of Systems   Review of Systems  Constitutional: Negative for chills and fever.  HENT: Negative for congestion and rhinorrhea.   Eyes: Positive for discharge and redness. Negative for visual disturbance.  Respiratory: Negative for shortness of breath and wheezing.   Cardiovascular: Negative for chest pain and palpitations.  Gastrointestinal: Negative for nausea and vomiting.  Genitourinary: Negative for dysuria and urgency.  Musculoskeletal: Negative for arthralgias and myalgias.  Skin: Negative for pallor and wound.  Neurological: Negative for dizziness and headaches.    Physical Exam Updated Vital Signs BP (!) 144/76 (BP Location: Right Arm)   Pulse 90   Temp 97.8 F (36.6 C) (Oral)   Resp 18   Ht 5\' 3"  (1.6 m)   Wt 81.6 kg   SpO2 100%   BMI 31.89 kg/m   Physical Exam Vitals and nursing note reviewed.  Constitutional:      General: She is not in acute distress.    Appearance: She is well-developed. She is not diaphoretic.  HENT:     Head: Normocephalic and atraumatic.  Eyes:  General:        Left eye: Discharge (watery, some purulent to the medial canthus) present.No foreign body.     Conjunctiva/sclera:     Left eye: Left conjunctiva is injected. Chemosis present.  Cardiovascular:     Rate and Rhythm: Normal rate and regular rhythm.     Heart sounds: No murmur heard.  No friction rub. No gallop.   Pulmonary:     Effort: Pulmonary effort is normal.     Breath sounds: No wheezing or rales.  Abdominal:     General: There is no distension.     Palpations: Abdomen is soft.     Tenderness: There is no abdominal tenderness.  Musculoskeletal:        General: No tenderness.     Cervical back: Normal range of motion and  neck supple.  Skin:    General: Skin is warm and dry.  Neurological:     Mental Status: She is alert and oriented to person, place, and time.  Psychiatric:        Behavior: Behavior normal.     ED Results / Procedures / Treatments   Labs (all labs ordered are listed, but only abnormal results are displayed) Labs Reviewed - No data to display  EKG None  Radiology No results found.  Procedures Procedures (including critical care time)  Medications Ordered in ED Medications - No data to display  ED Course  I have reviewed the triage vital signs and the nursing notes.  Pertinent labs & imaging results that were available during my care of the patient were reviewed by me and considered in my medical decision making (see chart for details).    MDM Rules/Calculators/A&P                          54 yo F with a cc of left eye drainage.  Matted shut in the morning.  Clinically with pink eye, no noted FB.  PEARL.  Start on antibiotic ointment, optho follow up.   12:47 PM:  I have discussed the diagnosis/risks/treatment options with the patient and believe the pt to be eligible for discharge home to follow-up with Ophtho. We also discussed returning to the ED immediately if new or worsening sx occur. We discussed the sx which are most concerning (e.g., sudden worsening pain, fever, inability to tolerate by mouth, change in vision) that necessitate immediate return. Medications administered to the patient during their visit and any new prescriptions provided to the patient are listed below.  Medications given during this visit Medications - No data to display   The patient appears reasonably screen and/or stabilized for discharge and I doubt any other medical condition or other [facility] requiring further screening, evaluation, or treatment in the ED at this time prior to discharge.   Final Clinical Impression(s) / ED Diagnoses Final diagnoses:  Bacterial conjunctivitis of left eye     Rx / DC Orders ED Discharge Orders         Ordered    erythromycin ophthalmic ointment        05/19/20 1202           Melene Plan, DO 05/19/20 1247

## 2020-05-31 ENCOUNTER — Emergency Department (HOSPITAL_BASED_OUTPATIENT_CLINIC_OR_DEPARTMENT_OTHER)
Admission: EM | Admit: 2020-05-31 | Discharge: 2020-05-31 | Disposition: A | Discharge status: Home / Self Care | Payer: Self-pay | Attending: Emergency Medicine | Admitting: Emergency Medicine

## 2020-05-31 ENCOUNTER — Encounter (HOSPITAL_BASED_OUTPATIENT_CLINIC_OR_DEPARTMENT_OTHER): Payer: Self-pay | Admitting: *Deleted

## 2020-05-31 ENCOUNTER — Other Ambulatory Visit: Payer: Self-pay

## 2020-05-31 ENCOUNTER — Other Ambulatory Visit (HOSPITAL_BASED_OUTPATIENT_CLINIC_OR_DEPARTMENT_OTHER): Payer: Self-pay | Admitting: Student

## 2020-05-31 DIAGNOSIS — M5442 Lumbago with sciatica, left side: Secondary | ICD-10-CM | POA: Insufficient documentation

## 2020-05-31 DIAGNOSIS — M5136 Other intervertebral disc degeneration, lumbar region: Secondary | ICD-10-CM | POA: Insufficient documentation

## 2020-05-31 DIAGNOSIS — M5432 Sciatica, left side: Secondary | ICD-10-CM

## 2020-05-31 DIAGNOSIS — M5134 Other intervertebral disc degeneration, thoracic region: Secondary | ICD-10-CM | POA: Insufficient documentation

## 2020-05-31 DIAGNOSIS — F17210 Nicotine dependence, cigarettes, uncomplicated: Secondary | ICD-10-CM | POA: Insufficient documentation

## 2020-05-31 DIAGNOSIS — M62830 Muscle spasm of back: Secondary | ICD-10-CM | POA: Insufficient documentation

## 2020-05-31 HISTORY — DX: Dorsalgia, unspecified: M54.9

## 2020-05-31 MED ORDER — KETOROLAC TROMETHAMINE 15 MG/ML IJ SOLN
15.0000 mg | Freq: Once | INTRAMUSCULAR | Status: AC
  Administered 2020-05-31: 15 mg via INTRAMUSCULAR
  Filled 2020-05-31: qty 1

## 2020-05-31 MED ORDER — LIDOCAINE 5 % EX PTCH: 1.0000 | MEDICATED_PATCH | CUTANEOUS | 0 refills | Status: DC

## 2020-05-31 MED ORDER — METHOCARBAMOL 500 MG PO TABS: 500.0000 mg | ORAL_TABLET | Freq: Every evening | ORAL | 0 refills | Status: DC | PRN

## 2020-05-31 MED ORDER — LIDOCAINE 5 % EX PTCH
1.0000 | MEDICATED_PATCH | CUTANEOUS | Status: DC
  Administered 2020-05-31: 1 via TRANSDERMAL
  Filled 2020-05-31: qty 1

## 2020-05-31 MED ORDER — PREDNISONE 10 MG (21) PO TBPK: ORAL_TABLET | Freq: Every day | ORAL | 0 refills | Status: DC

## 2020-05-31 NOTE — Discharge Instructions (Signed)
Take prednisone as prescribed.  Take entire course.  Do not take other anti-inflammatories at the same time open (Advil, Motrin, ibuprofen, naproxen, Aleve). You may supplement with Tylenol if you need further pain control. Use Robaxin as needed for muscle stiffness or soreness. Have caution, as this may make you tired or groggy. Do not drive or operate heavy machinery while taking this medication.  Use lidocaine patch to help with pain. Do gentle stretches to help with pain control. Follow up with your back doctor if pain is not improving with this treatment.  Return to the ER if you develop high fevers, numbness, loss of bowel or bladder control, or any new or concerning symptoms.   

## 2020-05-31 NOTE — ED Triage Notes (Signed)
Pt reports back injury at work in March. States back pain x 3 days, spasms x 1 day

## 2020-05-31 NOTE — ED Provider Notes (Signed)
[facility] EMERGENCY DEPARTMENT Provider Note   CSN: [id number] Arrival date & time: 05/31/20  1923     History Chief Complaint  Patient presents with  . Back Pain    Erika Lopez is a 54 y.o. female presenting for evaluation of back pain.  Patient states the past 3 days, she has had gradually worsening back pain.  It is all over her back, though initially began in her low back.  She states it radiates down her left leg several months ago, but was doing well until recently.  She denies new trauma or injury.  Denies precipitating event.  She denies fevers, chills, nausea, vomiting, Donnell pain, urinary symptoms, loss of bowel bladder control, numbness, history of cancer, history of IVDU.  She reports no other medical problems, takes no medications daily.  When she last had back problems, she followed with Dr. Myra Rude from orthopedics.  HPI     Past Medical History:  Diagnosis Date  . Back pain   . DDD (degenerative disc disease), lumbosacral   . DDD (degenerative disc disease), thoracic   . DDD (degenerative disc disease), thoracolumbar     There are no problems to display for this patient.   Past Surgical History:  Procedure Laterality Date  . CHOLECYSTECTOMY       OB History   No obstetric history on file.     No family history on file.  Social History   Tobacco Use  . Smoking status: Current Every Day Smoker    Packs/day: 0.50    Types: Cigarettes  . Smokeless tobacco: Never Used  Vaping Use  . Vaping Use: Never used  Substance Use Topics  . Alcohol use: Yes    Comment: occ  . Drug use: No    Home Medications Prior to Admission medications   Medication Sig Start Date End Date Taking? Authorizing Provider  erythromycin ophthalmic ointment Place a 1/2 inch ribbon of ointment into the lower eyelid four times a day for 7 days. 05/19/20   Melene Plan, DO  lidocaine (LIDODERM) 5 % Place 1 patch onto the skin daily. Remove & Discard patch within  12 hours or as directed by MD 05/31/20   Donaldo Teegarden, PA-C  methocarbamol (ROBAXIN) 500 MG tablet Take 1 tablet (500 mg total) by mouth at bedtime as needed for muscle spasms. 05/31/20   Tristen Luce, PA-C  predniSONE (STERAPRED UNI-PAK 21 TAB) 10 MG (21) TBPK tablet Take by mouth daily. Take 6 tabs by mouth daily  for 2 days, then 5 tabs for 2 days, then 4 tabs for 2 days, then 3 tabs for 2 days, 2 tabs for 2 days, then 1 tab by mouth daily for 2 days 05/31/20   Sanah Kraska, PA-C    Allergies    Patient has no known allergies.  Review of Systems   Review of Systems  Musculoskeletal: Positive for back pain.  Neurological: Negative for numbness.    Physical Exam Updated Vital Signs BP 116/81 (BP Location: Right Arm)   Pulse 95   Temp 98 F (36.7 C) (Oral)   Resp 18   Ht 5\' 8"  (1.727 m)   Wt 81.6 kg   SpO2 100%   BMI 27.37 kg/m   Physical Exam Vitals and nursing note reviewed.  Constitutional:      General: She is not in acute distress.    Appearance: She is well-developed.     Comments: Laying in the bed in no acute distress  HENT:  Head: Normocephalic and atraumatic.  Eyes:     Conjunctiva/sclera: Conjunctivae normal.     Pupils: Pupils are equal, round, and reactive to light.  Cardiovascular:     Rate and Rhythm: Normal rate and regular rhythm.     Pulses: Normal pulses.  Pulmonary:     Effort: Pulmonary effort is normal. No respiratory distress.     Breath sounds: Normal breath sounds. No wheezing.  Abdominal:     General: There is no distension.     Palpations: Abdomen is soft. There is no mass.     Tenderness: There is no abdominal tenderness. There is no guarding or rebound.  Musculoskeletal:        General: Tenderness present. Normal range of motion.     Cervical back: Normal range of motion and neck supple.     Comments: Diffuse tenderness palpation of the back without focal pain.  There is a knot with worsening pain and swelling of the right  mid back musculature.  No erythema, warmth, or signs of infection.  No contusion or signs of injury.  No saddle paresthesias.  Pedal pulses 2+ bilaterally.  Skin:    General: Skin is warm and dry.     Capillary Refill: Capillary refill takes less than 2 seconds.  Neurological:     Mental Status: She is alert and oriented to person, place, and time.     ED Results / Procedures / Treatments   Labs (all labs ordered are listed, but only abnormal results are displayed) Labs Reviewed - No data to display  EKG None  Radiology No results found.  Procedures Procedures (including critical care time)  Medications Ordered in ED Medications  ketorolac (TORADOL) 15 MG/ML injection 15 mg (has no administration in time range)  lidocaine (LIDODERM) 5 % 1 patch (has no administration in time range)    ED Course  I have reviewed the triage vital signs and the nursing notes.  Pertinent labs & imaging results that were available during my care of the patient were reviewed by me and considered in my medical decision making (see chart for details).    MDM Rules/Calculators/A&P                          Patient presenting for evaluation of low back pain.  Physical exam reassuring, neurovascularly intact.  No red flags for back pain.  Pain is reproducible with palpation of the musculature.  Likely musculoskeletal.  As patient has shooting pain down her left leg, likely also sciatica.  Doubt fracture, I do not believe x-rays will be beneficial.  Doubt vertebral injury, infection, spinal cord compression, myelopathy, or cauda equina syndrome.  Will treat symptomatically with prednisone, muscle relaxers, muscle creams.  Patient to follow-up with orthopedics.  At this time, patient appears safe for discharge.  Return precautions given.  Patient states she understands and agrees to plan.  Final Clinical Impression(s) / ED Diagnoses Final diagnoses:  Back spasm  Sciatica of left side    Rx / DC  Orders ED Discharge Orders         Ordered    predniSONE (STERAPRED UNI-PAK 21 TAB) 10 MG (21) TBPK tablet  Daily        05/31/20 2049    methocarbamol (ROBAXIN) 500 MG tablet  At bedtime PRN        05/31/20 2049    lidocaine (LIDODERM) 5 %  Every 24 hours  05/31/20 2049           Alveria Apley, PA-C 05/31/20 2118    Pricilla Loveless, MD 06/01/20 [phone]

## 2020-06-02 MED FILL — METHOCARBAMOL 500 MG TABS: 500 | 10 days supply | Qty: 10 | Fill #0

## 2020-06-02 MED FILL — predniSONE 10 MG TABS: 10 | 12 days supply | Qty: 42 | Fill #0

## 2020-08-28 IMAGING — DX DG LUMBAR SPINE COMPLETE 4+V
5 series · 5 of 5 positions shown · non-contrast
Comparison: Radiographs dated 02/10/2015

CLINICAL DATA: Low back pain and left lower extremity radiculopathy
since a fall 2 days ago.

EXAM:
LUMBAR SPINE - COMPLETE 4+ VIEW

[l-spine ap]
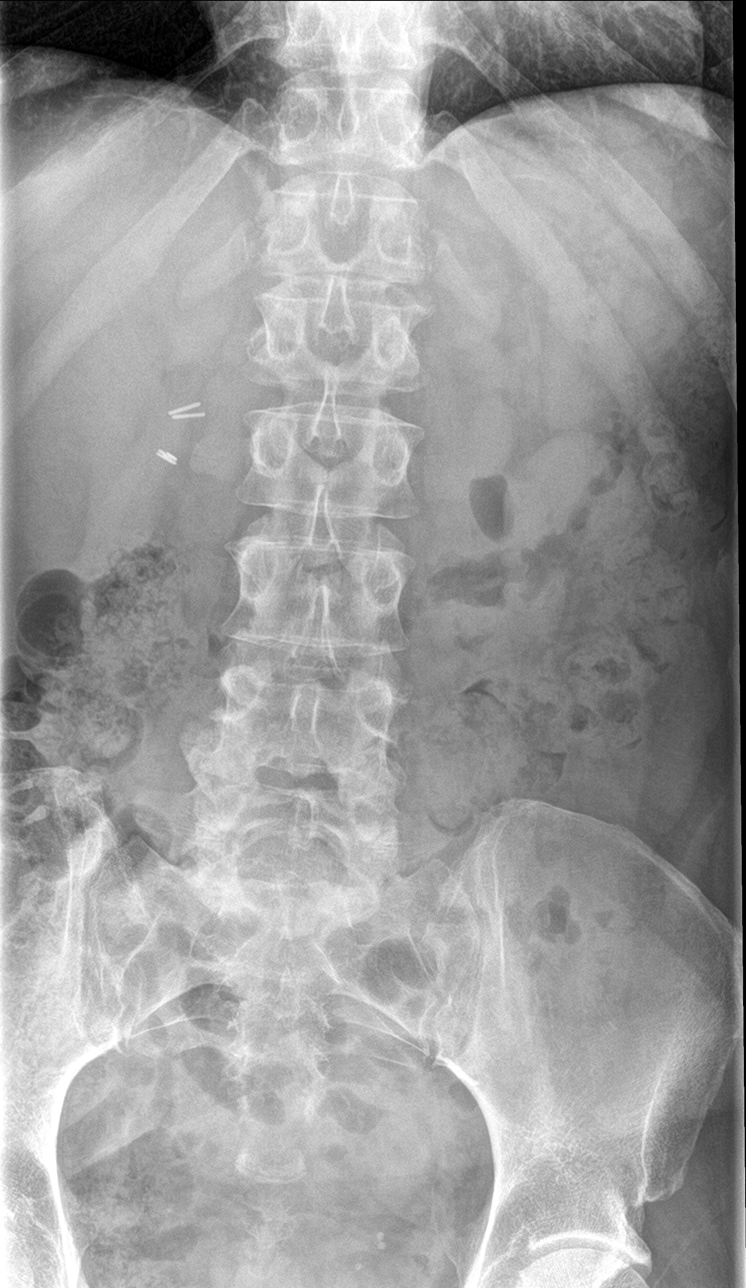

[l-spine obl (1 of 2)]
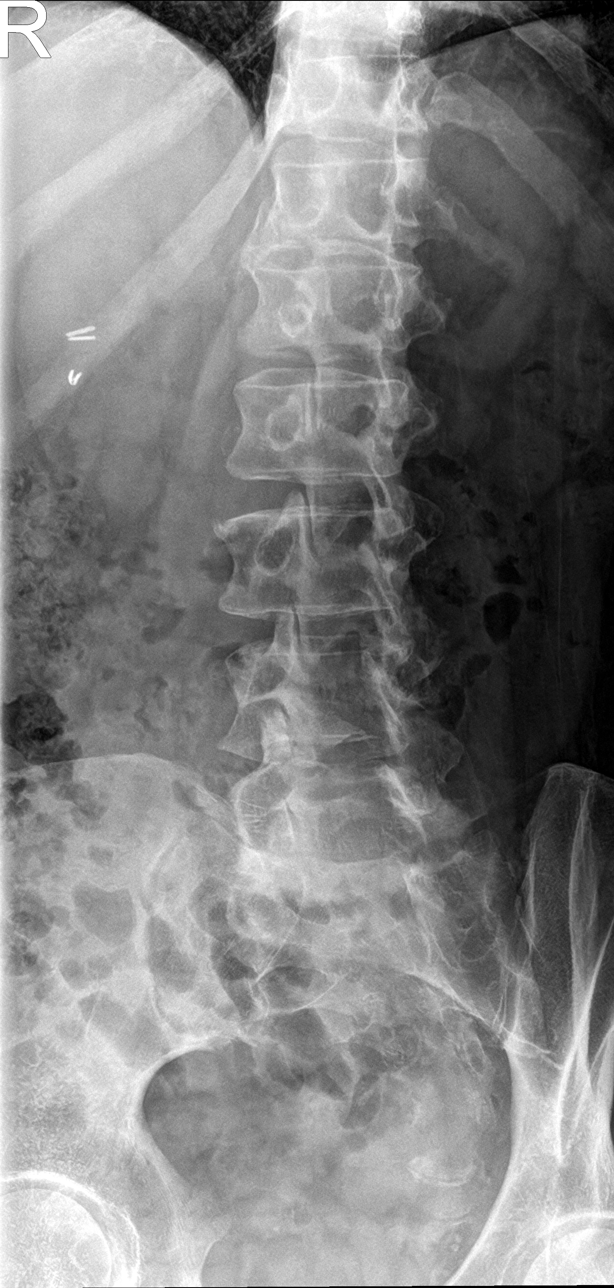

[l-spine obl (2 of 2)]
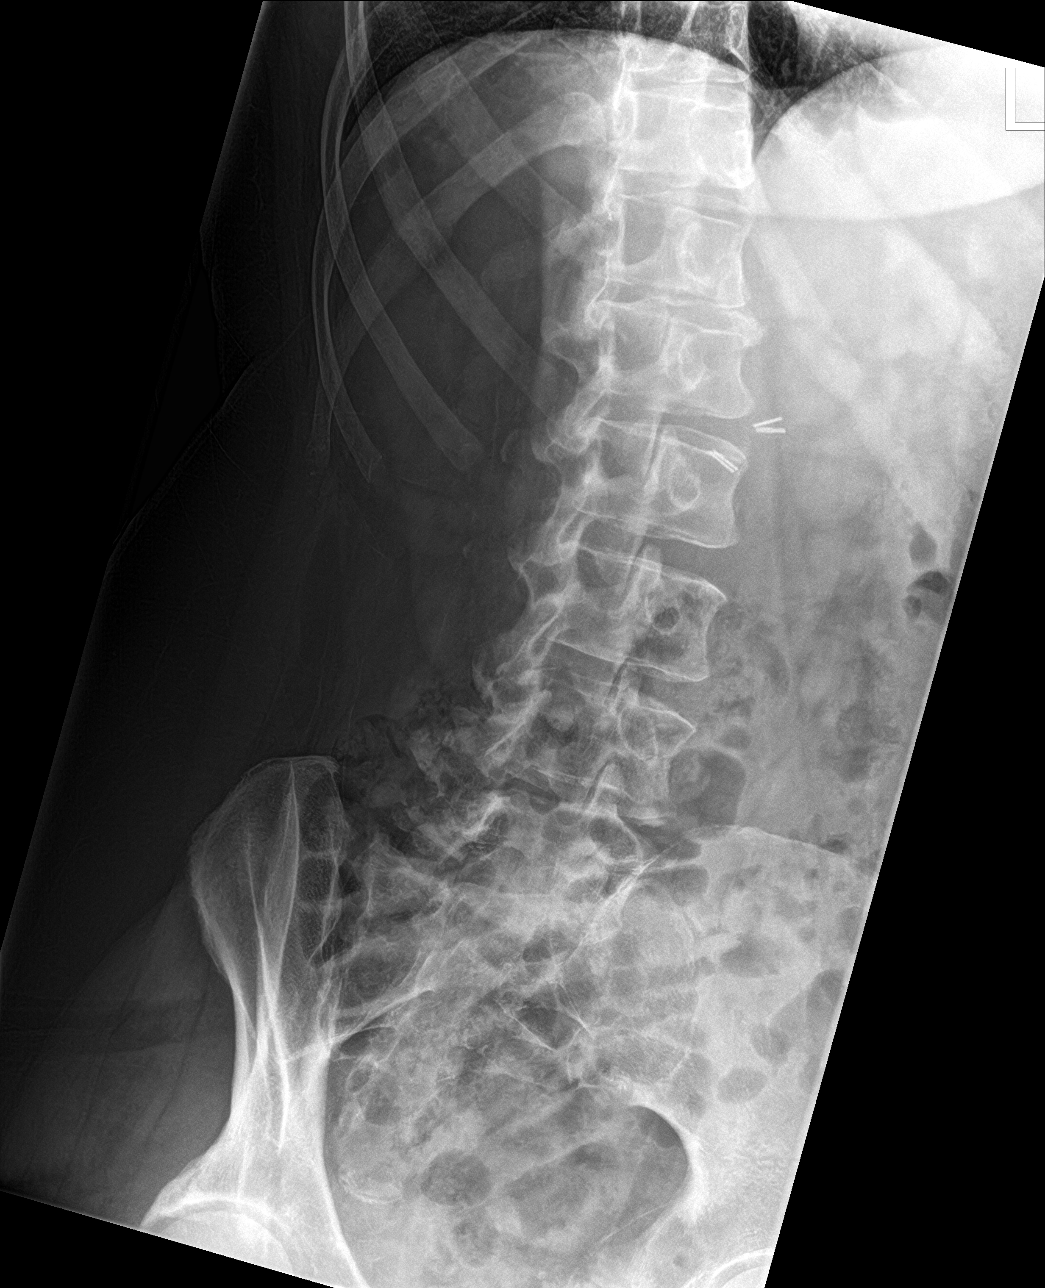

[l-spine lat]
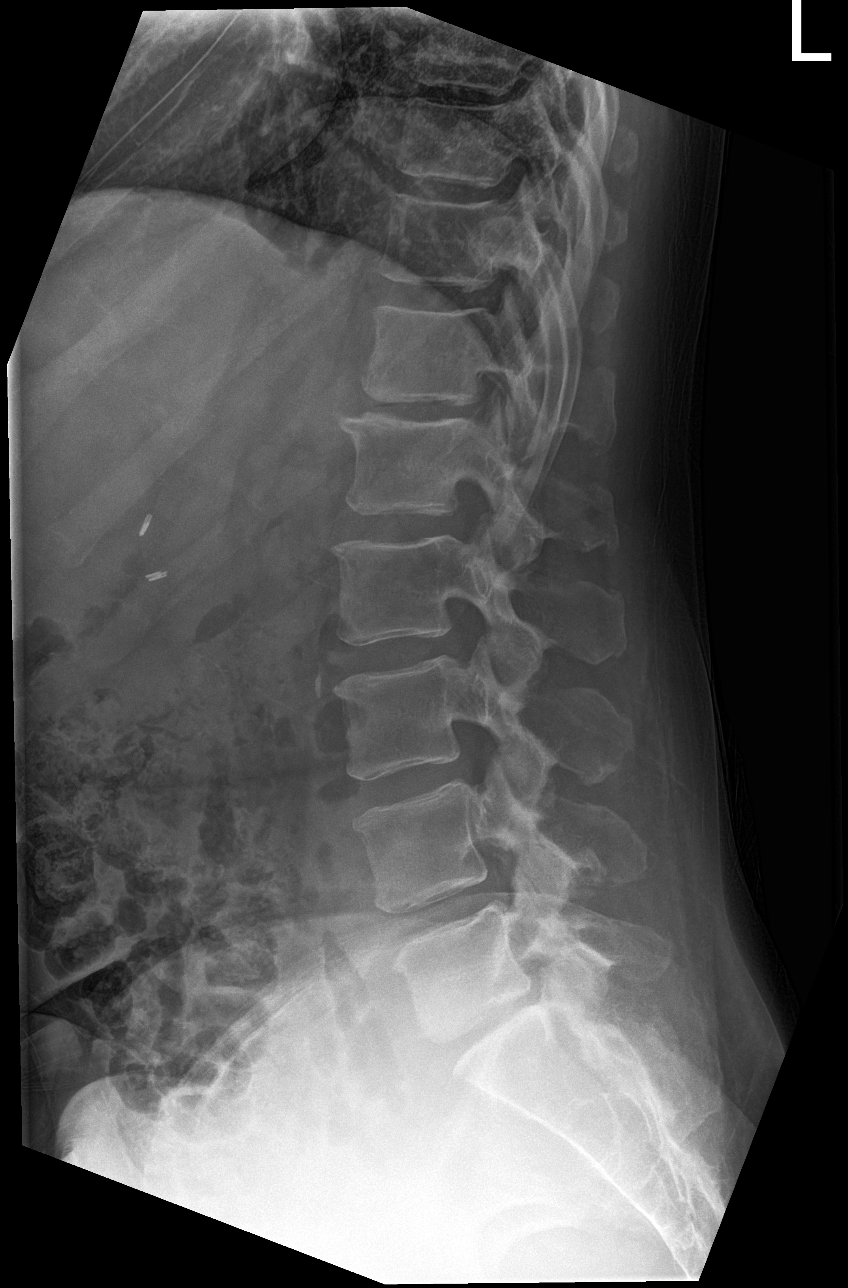

[l-spine spot]
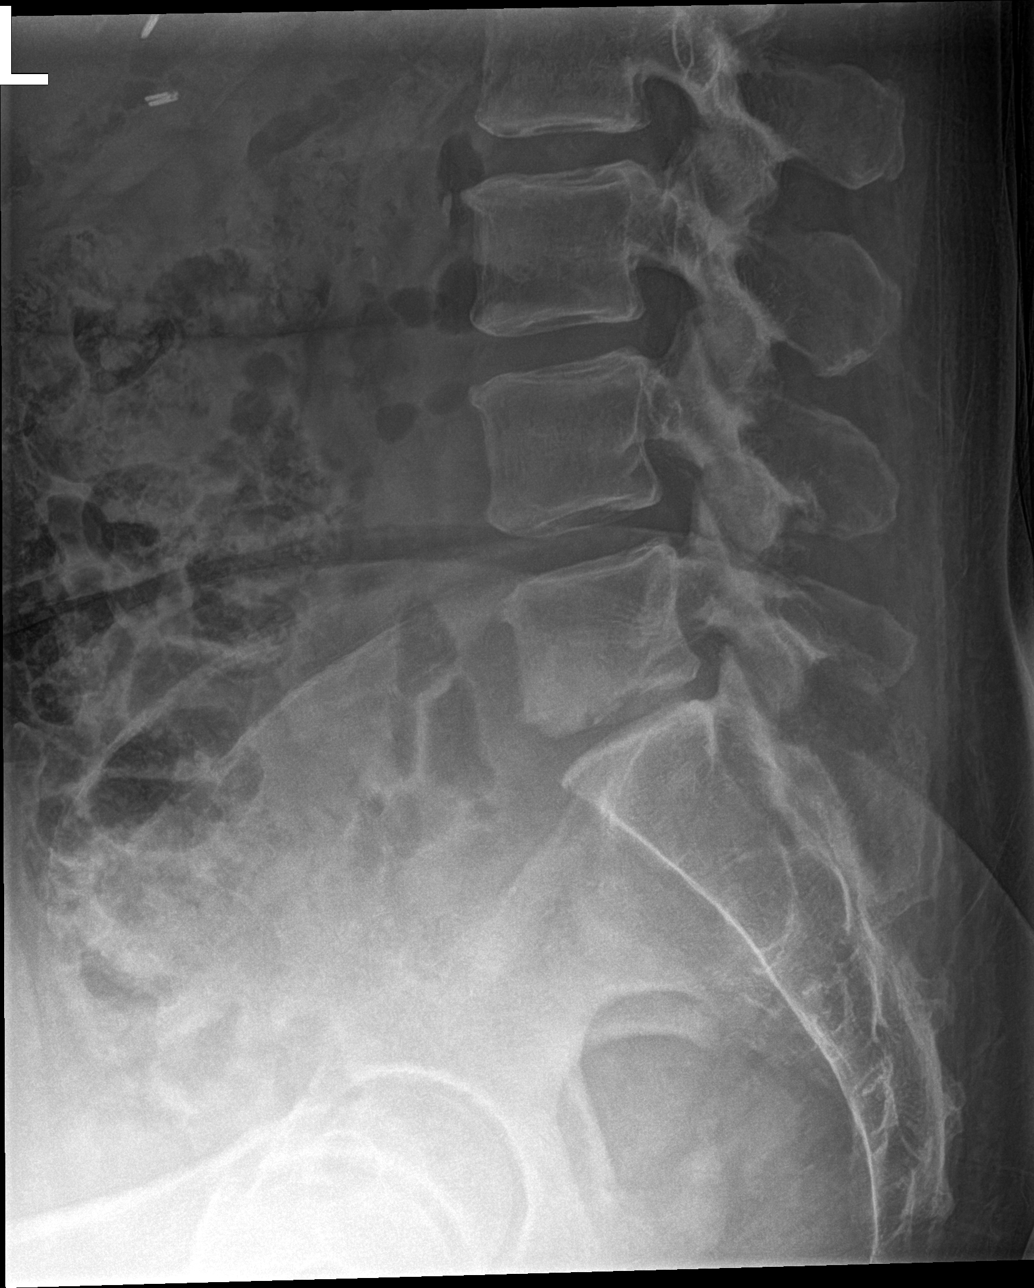

[5 of 5 positions shown; findings below may reference images not displayed]

FINDINGS: There is no fracture or bone destruction. Alignment is normal.
Slight narrowing of the L5-S1 disc space. Slight facet arthritis at
L4-5 on the right, new since the prior study. Sacroiliac joints
appear normal.
IMPRESSION: No acute abnormality. Slight degenerative disc disease at L5-S1. New
slight facet arthritis at L4-5 on the right.

## 2020-08-28 IMAGING — DX DG HIP (WITH OR WITHOUT PELVIS) 2-3V*L*
3 series · 3 of 3 positions shown · non-contrast
Comparison: None.

CLINICAL DATA: Low back pain and left lower extremity radiculopathy
since a fall 2 days ago.

EXAM:
DG HIP (WITH OR WITHOUT PELVIS) 2-3V LEFT

[pelvis ap]
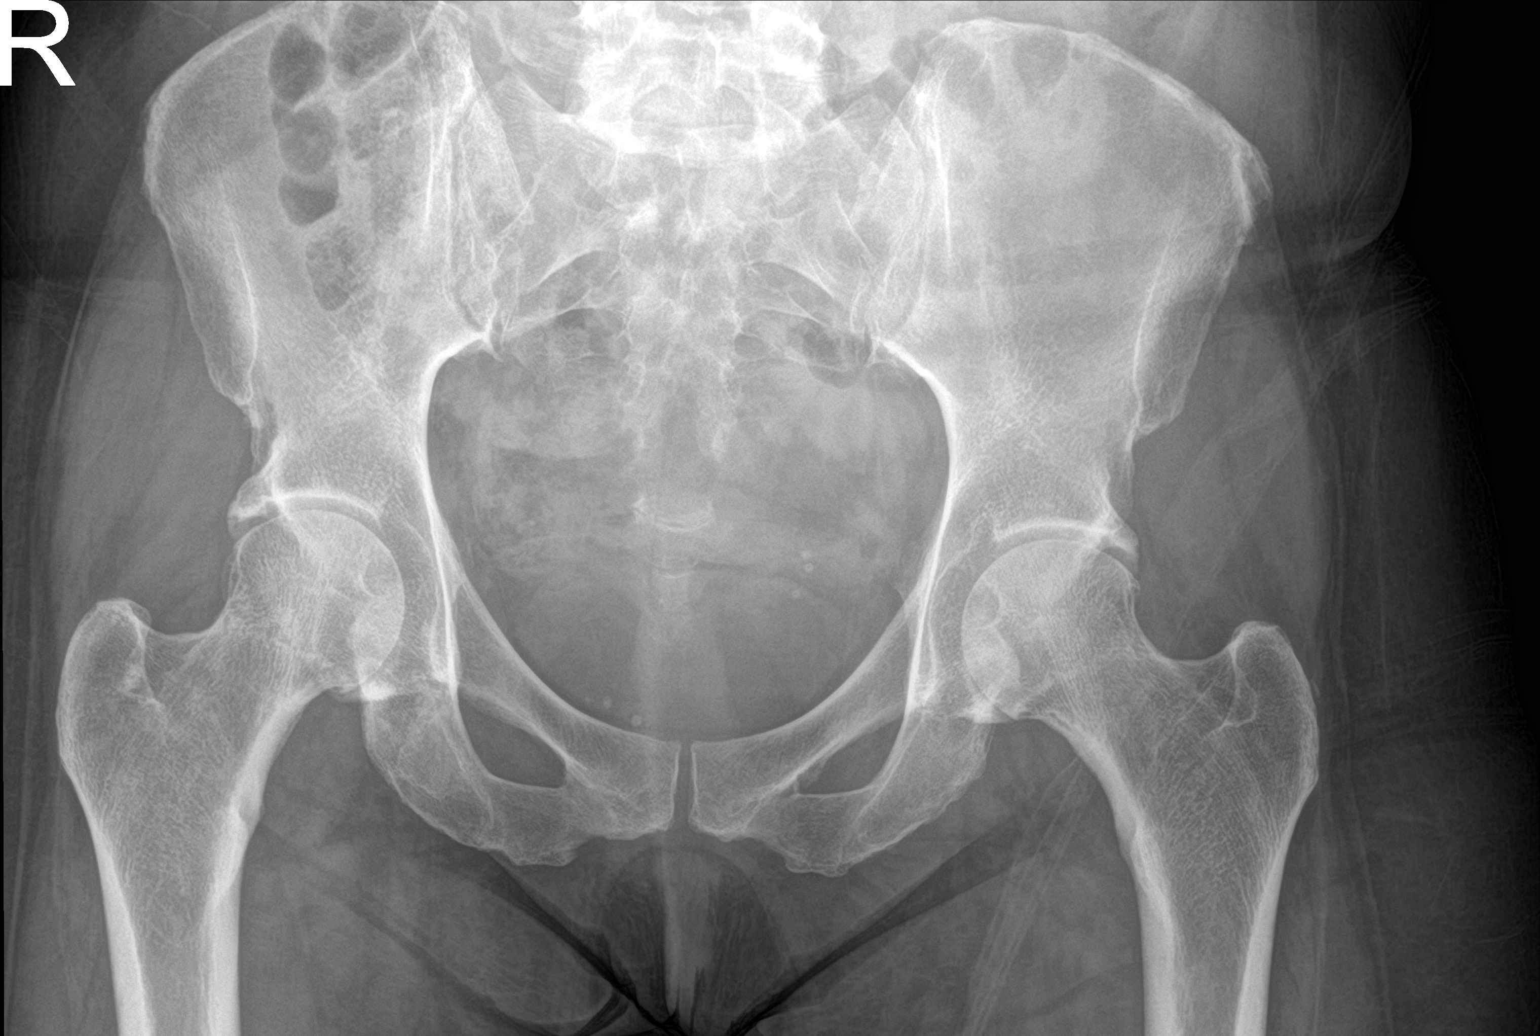

[hip ap]
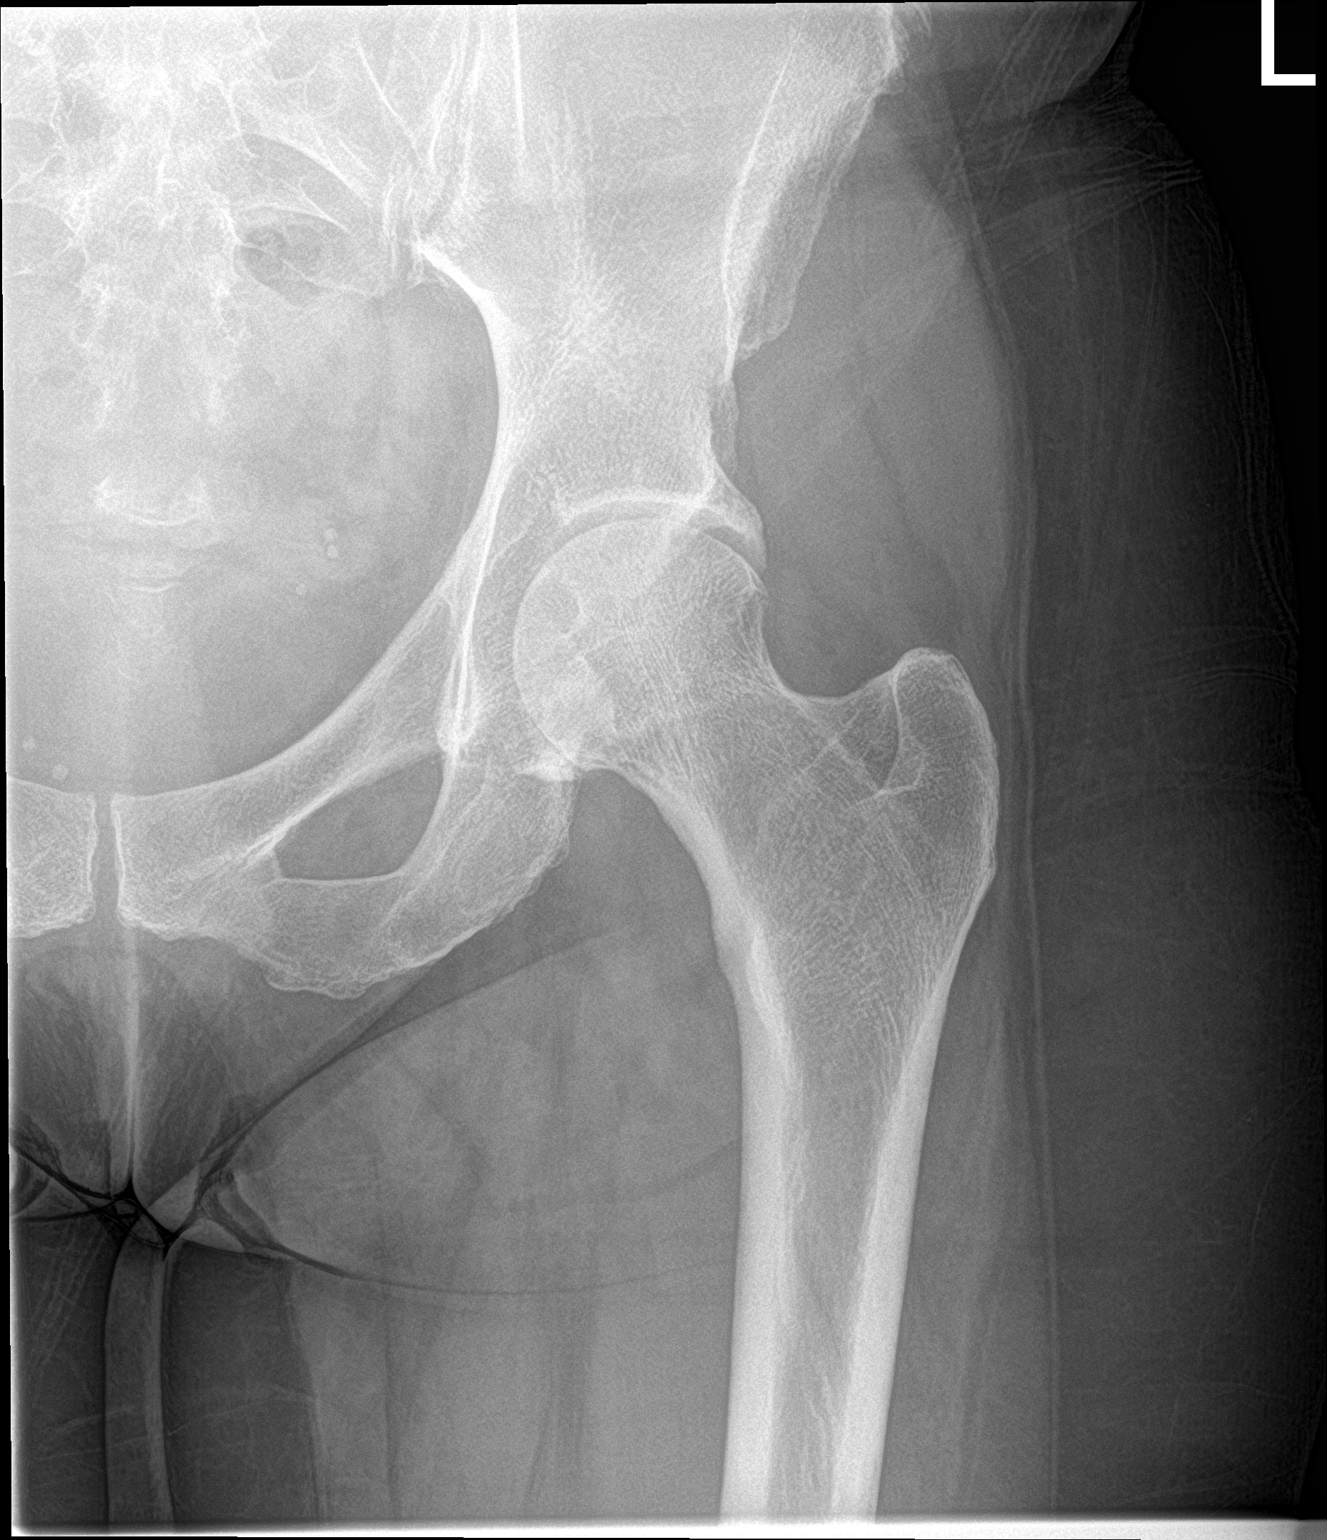

[hip lat]
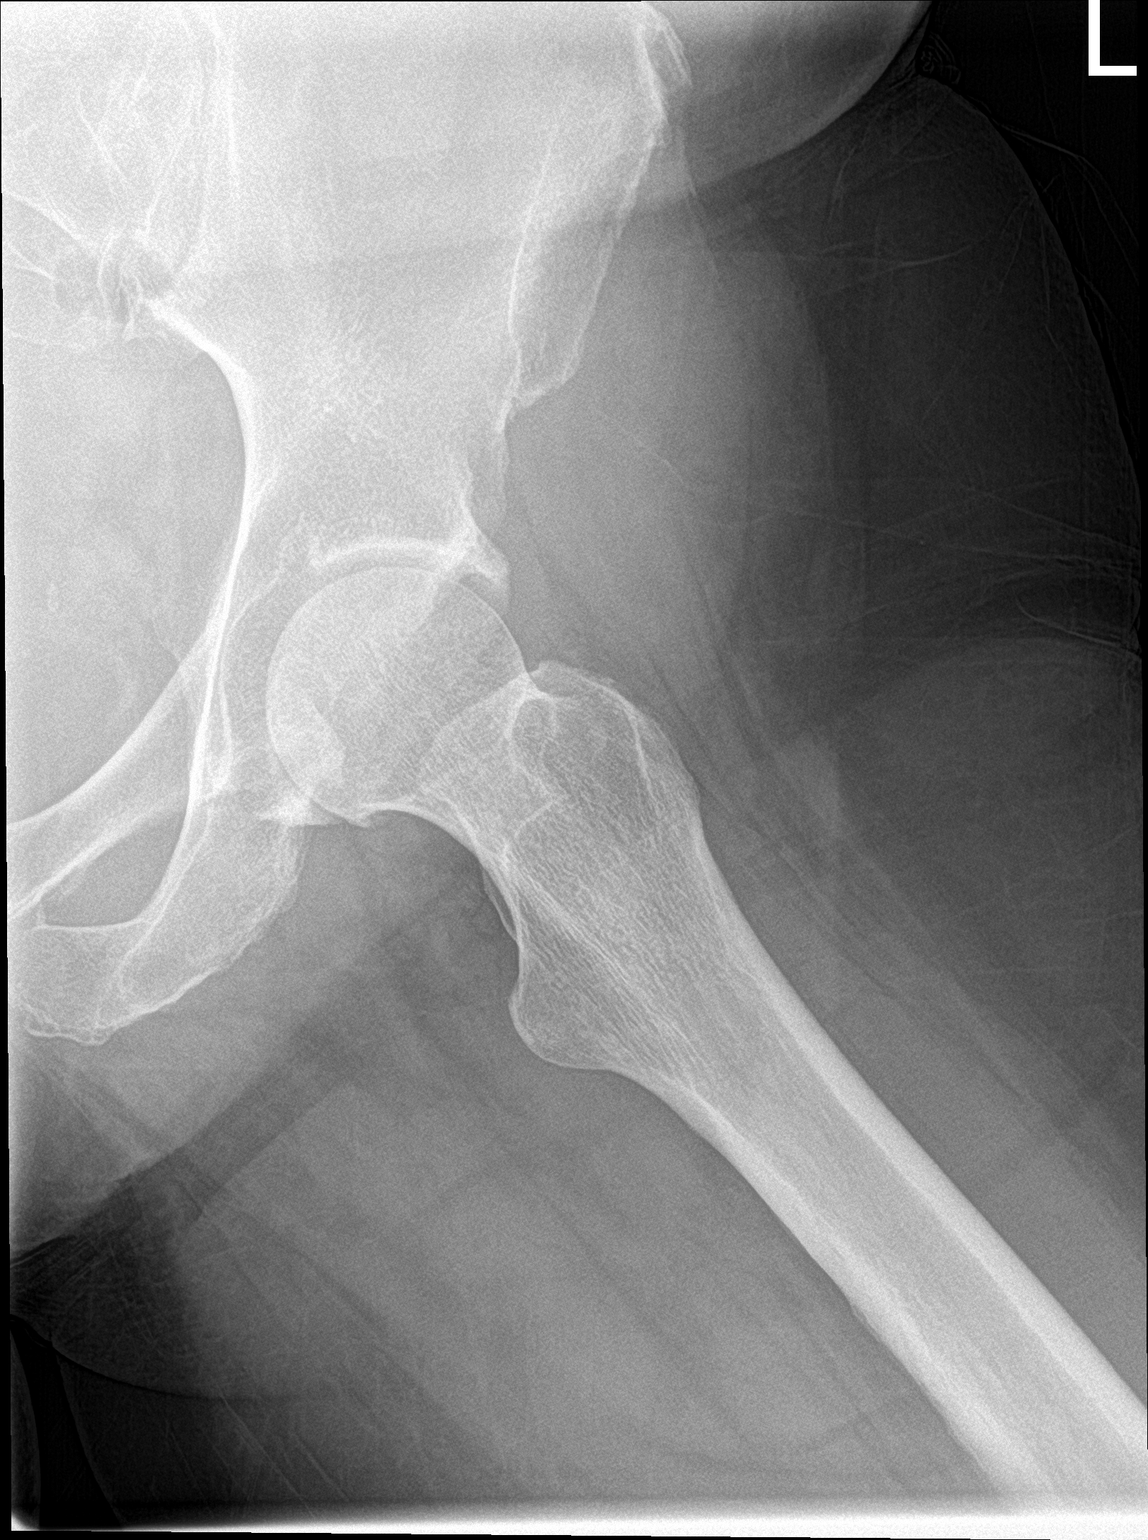

[3 of 3 positions shown; findings below may reference images not displayed]

FINDINGS: There is no free pelvic fracture or left hip fracture. Tiny
osteophyte on the left femoral head. No joint space narrowing. Soft
tissues are normal.
IMPRESSION: Minimal degenerative changes of the left hip.  No acute abnormality.

## 2022-07-25 DIAGNOSIS — I499 Cardiac arrhythmia, unspecified: Secondary | ICD-10-CM

## 2022-07-25 HISTORY — DX: Cardiac arrhythmia, unspecified: I49.9

## 2023-03-17 DIAGNOSIS — Z171 Estrogen receptor negative status [ER-]: Secondary | ICD-10-CM | POA: Diagnosis not present

## 2023-03-17 DIAGNOSIS — C50511 Malignant neoplasm of lower-outer quadrant of right female breast: Secondary | ICD-10-CM | POA: Diagnosis not present

## 2023-04-03 DIAGNOSIS — C50511 Malignant neoplasm of lower-outer quadrant of right female breast: Secondary | ICD-10-CM | POA: Diagnosis not present

## 2023-04-03 DIAGNOSIS — Z171 Estrogen receptor negative status [ER-]: Secondary | ICD-10-CM | POA: Diagnosis not present

## 2023-04-03 DIAGNOSIS — Z5111 Encounter for antineoplastic chemotherapy: Secondary | ICD-10-CM | POA: Diagnosis not present

## 2023-04-25 DIAGNOSIS — C50511 Malignant neoplasm of lower-outer quadrant of right female breast: Secondary | ICD-10-CM | POA: Diagnosis not present

## 2023-04-25 DIAGNOSIS — I071 Rheumatic tricuspid insufficiency: Secondary | ICD-10-CM | POA: Diagnosis not present

## 2023-04-25 DIAGNOSIS — Z171 Estrogen receptor negative status [ER-]: Secondary | ICD-10-CM | POA: Diagnosis not present

## 2023-04-26 DIAGNOSIS — Z171 Estrogen receptor negative status [ER-]: Secondary | ICD-10-CM | POA: Diagnosis not present

## 2023-04-26 DIAGNOSIS — C50511 Malignant neoplasm of lower-outer quadrant of right female breast: Secondary | ICD-10-CM | POA: Diagnosis not present

## 2023-05-01 DIAGNOSIS — Z171 Estrogen receptor negative status [ER-]: Secondary | ICD-10-CM | POA: Diagnosis not present

## 2023-05-01 DIAGNOSIS — C50511 Malignant neoplasm of lower-outer quadrant of right female breast: Secondary | ICD-10-CM | POA: Diagnosis not present

## 2023-05-04 DIAGNOSIS — C50511 Malignant neoplasm of lower-outer quadrant of right female breast: Secondary | ICD-10-CM | POA: Diagnosis not present

## 2023-05-04 DIAGNOSIS — D360 Benign neoplasm of lymph nodes: Secondary | ICD-10-CM | POA: Diagnosis not present

## 2023-05-04 DIAGNOSIS — Z171 Estrogen receptor negative status [ER-]: Secondary | ICD-10-CM | POA: Diagnosis not present

## 2023-05-05 DIAGNOSIS — Z5111 Encounter for antineoplastic chemotherapy: Secondary | ICD-10-CM | POA: Diagnosis not present

## 2023-05-05 DIAGNOSIS — Z171 Estrogen receptor negative status [ER-]: Secondary | ICD-10-CM | POA: Diagnosis not present

## 2023-05-05 DIAGNOSIS — C50511 Malignant neoplasm of lower-outer quadrant of right female breast: Secondary | ICD-10-CM | POA: Diagnosis not present

## 2023-05-19 DIAGNOSIS — Z171 Estrogen receptor negative status [ER-]: Secondary | ICD-10-CM | POA: Diagnosis not present

## 2023-05-19 DIAGNOSIS — C50511 Malignant neoplasm of lower-outer quadrant of right female breast: Secondary | ICD-10-CM | POA: Diagnosis not present

## 2023-05-19 DIAGNOSIS — Z5111 Encounter for antineoplastic chemotherapy: Secondary | ICD-10-CM | POA: Diagnosis not present

## 2023-05-19 DIAGNOSIS — Z5112 Encounter for antineoplastic immunotherapy: Secondary | ICD-10-CM | POA: Diagnosis not present

## 2023-05-31 DIAGNOSIS — K219 Gastro-esophageal reflux disease without esophagitis: Secondary | ICD-10-CM | POA: Diagnosis not present

## 2023-05-31 DIAGNOSIS — I498 Other specified cardiac arrhythmias: Secondary | ICD-10-CM | POA: Diagnosis not present

## 2023-06-01 DIAGNOSIS — M2550 Pain in unspecified joint: Secondary | ICD-10-CM | POA: Diagnosis not present

## 2023-06-01 DIAGNOSIS — Z171 Estrogen receptor negative status [ER-]: Secondary | ICD-10-CM | POA: Diagnosis not present

## 2023-06-01 DIAGNOSIS — R21 Rash and other nonspecific skin eruption: Secondary | ICD-10-CM | POA: Diagnosis not present

## 2023-06-08 DIAGNOSIS — M79652 Pain in left thigh: Secondary | ICD-10-CM | POA: Diagnosis not present

## 2023-06-08 DIAGNOSIS — R52 Pain, unspecified: Secondary | ICD-10-CM | POA: Diagnosis not present

## 2023-06-08 DIAGNOSIS — M25552 Pain in left hip: Secondary | ICD-10-CM | POA: Diagnosis not present

## 2023-06-09 DIAGNOSIS — Z171 Estrogen receptor negative status [ER-]: Secondary | ICD-10-CM | POA: Diagnosis not present

## 2023-06-09 DIAGNOSIS — C50511 Malignant neoplasm of lower-outer quadrant of right female breast: Secondary | ICD-10-CM | POA: Diagnosis not present

## 2023-06-13 DIAGNOSIS — M25512 Pain in left shoulder: Secondary | ICD-10-CM | POA: Diagnosis not present

## 2023-06-13 DIAGNOSIS — R600 Localized edema: Secondary | ICD-10-CM | POA: Diagnosis not present

## 2023-06-13 DIAGNOSIS — M79641 Pain in right hand: Secondary | ICD-10-CM | POA: Diagnosis not present

## 2023-06-13 DIAGNOSIS — M2550 Pain in unspecified joint: Secondary | ICD-10-CM | POA: Diagnosis not present

## 2023-06-16 DIAGNOSIS — M79641 Pain in right hand: Secondary | ICD-10-CM | POA: Diagnosis not present

## 2023-06-16 DIAGNOSIS — Z79891 Long term (current) use of opiate analgesic: Secondary | ICD-10-CM | POA: Diagnosis not present

## 2023-06-16 DIAGNOSIS — C50511 Malignant neoplasm of lower-outer quadrant of right female breast: Secondary | ICD-10-CM | POA: Diagnosis not present

## 2023-06-16 DIAGNOSIS — M542 Cervicalgia: Secondary | ICD-10-CM | POA: Diagnosis not present

## 2023-06-16 DIAGNOSIS — Z17421 Hormone receptor negative with human epidermal growth factor receptor 2 negative status: Secondary | ICD-10-CM | POA: Diagnosis not present

## 2023-06-16 DIAGNOSIS — M25441 Effusion, right hand: Secondary | ICD-10-CM | POA: Diagnosis not present

## 2023-06-16 DIAGNOSIS — Z95828 Presence of other vascular implants and grafts: Secondary | ICD-10-CM | POA: Diagnosis not present

## 2023-06-17 DIAGNOSIS — M1990 Unspecified osteoarthritis, unspecified site: Secondary | ICD-10-CM | POA: Insufficient documentation

## 2023-06-30 DIAGNOSIS — Z2989 Encounter for other specified prophylactic measures: Secondary | ICD-10-CM | POA: Diagnosis not present

## 2023-06-30 DIAGNOSIS — Z5111 Encounter for antineoplastic chemotherapy: Secondary | ICD-10-CM | POA: Diagnosis not present

## 2023-06-30 DIAGNOSIS — M1990 Unspecified osteoarthritis, unspecified site: Secondary | ICD-10-CM | POA: Diagnosis not present

## 2023-06-30 DIAGNOSIS — Z171 Estrogen receptor negative status [ER-]: Secondary | ICD-10-CM | POA: Diagnosis not present

## 2023-06-30 DIAGNOSIS — C50511 Malignant neoplasm of lower-outer quadrant of right female breast: Secondary | ICD-10-CM | POA: Diagnosis not present

## 2023-07-07 DIAGNOSIS — Z171 Estrogen receptor negative status [ER-]: Secondary | ICD-10-CM | POA: Diagnosis not present

## 2023-07-07 DIAGNOSIS — Z5111 Encounter for antineoplastic chemotherapy: Secondary | ICD-10-CM | POA: Diagnosis not present

## 2023-07-07 DIAGNOSIS — C50511 Malignant neoplasm of lower-outer quadrant of right female breast: Secondary | ICD-10-CM | POA: Diagnosis not present

## 2023-07-07 DIAGNOSIS — Z5112 Encounter for antineoplastic immunotherapy: Secondary | ICD-10-CM | POA: Diagnosis not present

## 2023-07-07 DIAGNOSIS — F3289 Other specified depressive episodes: Secondary | ICD-10-CM | POA: Diagnosis not present

## 2023-07-14 DIAGNOSIS — C50511 Malignant neoplasm of lower-outer quadrant of right female breast: Secondary | ICD-10-CM | POA: Diagnosis not present

## 2023-07-14 DIAGNOSIS — Z171 Estrogen receptor negative status [ER-]: Secondary | ICD-10-CM | POA: Diagnosis not present

## 2023-07-14 DIAGNOSIS — Z5111 Encounter for antineoplastic chemotherapy: Secondary | ICD-10-CM | POA: Diagnosis not present

## 2023-07-27 DIAGNOSIS — Z171 Estrogen receptor negative status [ER-]: Secondary | ICD-10-CM | POA: Diagnosis not present

## 2023-07-27 DIAGNOSIS — C50511 Malignant neoplasm of lower-outer quadrant of right female breast: Secondary | ICD-10-CM | POA: Diagnosis not present

## 2023-07-31 DIAGNOSIS — Z5111 Encounter for antineoplastic chemotherapy: Secondary | ICD-10-CM | POA: Diagnosis not present

## 2023-07-31 DIAGNOSIS — C50511 Malignant neoplasm of lower-outer quadrant of right female breast: Secondary | ICD-10-CM | POA: Diagnosis not present

## 2023-07-31 DIAGNOSIS — Z171 Estrogen receptor negative status [ER-]: Secondary | ICD-10-CM | POA: Diagnosis not present

## 2023-08-02 ENCOUNTER — Telehealth: Payer: Self-pay | Admitting: Hematology and Oncology

## 2023-08-02 NOTE — Telephone Encounter (Signed)
 Called to get the patient scheduled per 1/8 secure chat.Both numbers listed for the patient are not in service. Called the patients mother and left a VM for the patient to call back to schedule an appointment.

## 2023-08-04 ENCOUNTER — Inpatient Hospital Stay: Payer: Medicaid Other | Attending: Hematology and Oncology | Admitting: Hematology and Oncology

## 2023-08-04 ENCOUNTER — Inpatient Hospital Stay: Payer: Medicaid Other | Admitting: Licensed Clinical Social Worker

## 2023-08-04 ENCOUNTER — Encounter: Payer: Self-pay | Admitting: Hematology and Oncology

## 2023-08-04 ENCOUNTER — Inpatient Hospital Stay: Payer: Medicaid Other

## 2023-08-04 ENCOUNTER — Inpatient Hospital Stay: Payer: Medicaid Other | Attending: Hematology and Oncology

## 2023-08-04 VITALS — BP 127/71 | HR 101 | Temp 98.5°F | Resp 18 | Ht 68.0 in | Wt 168.8 lb

## 2023-08-04 DIAGNOSIS — Z5986 Financial insecurity: Secondary | ICD-10-CM | POA: Insufficient documentation

## 2023-08-04 DIAGNOSIS — M79643 Pain in unspecified hand: Secondary | ICD-10-CM | POA: Insufficient documentation

## 2023-08-04 DIAGNOSIS — Z171 Estrogen receptor negative status [ER-]: Secondary | ICD-10-CM | POA: Diagnosis not present

## 2023-08-04 DIAGNOSIS — Z170 Estrogen receptor positive status [ER+]: Secondary | ICD-10-CM | POA: Diagnosis not present

## 2023-08-04 DIAGNOSIS — Z59819 Housing instability, housed unspecified: Secondary | ICD-10-CM | POA: Diagnosis not present

## 2023-08-04 DIAGNOSIS — Z79899 Other long term (current) drug therapy: Secondary | ICD-10-CM | POA: Diagnosis not present

## 2023-08-04 DIAGNOSIS — Z5189 Encounter for other specified aftercare: Secondary | ICD-10-CM | POA: Diagnosis not present

## 2023-08-04 DIAGNOSIS — Z7952 Long term (current) use of systemic steroids: Secondary | ICD-10-CM | POA: Insufficient documentation

## 2023-08-04 DIAGNOSIS — C50511 Malignant neoplasm of lower-outer quadrant of right female breast: Secondary | ICD-10-CM | POA: Insufficient documentation

## 2023-08-04 DIAGNOSIS — M25559 Pain in unspecified hip: Secondary | ICD-10-CM | POA: Diagnosis not present

## 2023-08-04 DIAGNOSIS — R229 Localized swelling, mass and lump, unspecified: Secondary | ICD-10-CM | POA: Diagnosis not present

## 2023-08-04 DIAGNOSIS — F17210 Nicotine dependence, cigarettes, uncomplicated: Secondary | ICD-10-CM | POA: Insufficient documentation

## 2023-08-04 DIAGNOSIS — R5383 Other fatigue: Secondary | ICD-10-CM | POA: Insufficient documentation

## 2023-08-04 MED ORDER — PEGFILGRASTIM-CBQV 6 MG/0.6ML ~~LOC~~ SOSY
6.0000 mg | PREFILLED_SYRINGE | Freq: Once | SUBCUTANEOUS | Status: AC
Start: 2023-08-04 — End: 2023-08-04
  Administered 2023-08-04: 6 mg via SUBCUTANEOUS
  Filled 2023-08-04: qty 0.6

## 2023-08-04 NOTE — Progress Notes (Signed)
 [facility] Clinical Social Work  Initial Assessment   Erika Lopez is a 58 y.o. year old female presenting alone. Clinical Social Work was referred by medical provider for assessment of psychosocial needs.   SDOH (Social Determinants of Health) assessments performed: Yes SDOH Interventions    Flowsheet Row Clinical Support from 08/04/2023 in [facility] [place]  SDOH Interventions   Food Insecurity Interventions Other (Comment)  [[facility] food pantry, [facility] food pantry list,  already has SNAP]  Housing Interventions Community Resources Provided  Transportation Interventions Payor Benefit       SDOH Screenings   Food Insecurity: Food Insecurity Present (08/04/2023)  Housing: High Risk (08/04/2023)  Transportation Needs: No Transportation Needs (08/04/2023)  Utilities: Low Risk  (06/01/2023)   Received from [facility]  Tobacco Use: Medium Risk (07/31/2023)   Received from [facility]     Distress Screen completed: No     No data to display            Family/Social Information:  Housing Arrangement: patient lives alone. She rents Family members/support persons in your life? Some family although limited local Transportation concerns: no  Employment: Out on short term disability due to cancer. 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 Has SNAP but limited amount each month Religious or spiritual practice: Not known Services Currently in place:  [facility] Medicaid; SNAP; applied ofr social security disability  Coping/ Adjustment to diagnosis: Patient understands treatment plan and what happens next? yes, transferring care to [facility], started care prior to now at a different center. Most concerned regarding finances right now. 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, Actuary, and Microbiologist and/or priorities: be able to stay housed Current coping skills/ strengths: Capable of independent living  and Motivation for treatment/growth     SUMMARY: Current SDOH Barriers:  Financial constraints related to being on short term disability due to cancer  Clinical Social Work Clinical Goal(s):  Scientist, research (life sciences) options for unmet needs related to:  Financial Strain   Interventions: Discussed common feeling and emotions when being diagnosed with cancer, and the importance of support during treatment Informed patient of the support team roles and support services at [facility] Provided CSW contact information and encouraged patient to call with any questions or concerns Provided patient with information about cancer foundations and local [employer] resources for assistance. Will set up appointment to follow-up on applications and potentially assist with LIEAP application Referred to Erika Lopez for [employer]   Follow Up Plan: Patient will work on gathering documents for applications. CSW will call pt next week to follow-up on resources Patient verbalizes understanding of plan: Yes    Lavell Supple E Wei Poplaski, LCSW Clinical Social Worker [facility]  Patient is participating in a Managed Medicaid Plan:  Yes

## 2023-08-04 NOTE — Progress Notes (Signed)
 [facility] CONSULT NOTE  Patient Care Team: Patient, No Pcp Per as PCP - General (General Practice)  CHIEF COMPLAINTS/PURPOSE OF CONSULTATION:  Triple negative cancer transferring care to [place]  HISTORY OF PRESENTING ILLNESS:    History of Present Illness   The patient, diagnosed with triple negative breast cancer at [facility] in [facility], presents with concerns about her treatment and side effects. She reports experiencing pain in her hip and hands following her chemotherapy treatments. The pain was so severe that she was unable to move her hands and had to crawl to the bathroom. The patient also mentions a nodule that appeared on her skin, which was biopsied and found to be positive for breast cancer. She has completed four rounds of Taxol, carboplatin, and Keytruda , and one round of Adriamycin  and Cytoxan  Keytruda . She reports feeling tired and exhausted after her treatments, but denies experiencing nausea or constipation.  She also mentions financial difficulties, as she has been living alone and has been unable to work. She is currently on short term disability.     I reviewed her records extensively and collaborated the history with the patient.  SUMMARY OF ONCOLOGIC HISTORY: Oncology History  Malignant neoplasm of lower-outer quadrant of right breast of female, estrogen receptor negative (HCC)  02/22/2023 Initial Diagnosis   [facility]: Palpable mass, mammogram detected right breast mass 5 cm from the nipple 1.6 cm, separate oval circumscribed hypoechoic mass 7 mm (?  Intramammary lymph node: Benign) grouped amorphous calcifications UOQ left breast 7 mm (benign), right axillary lymph node biopsy negative Biopsy: Poorly differentiated IDC ER less than 1%, PR less than 1%, HER2 0, Ki-67 90%, skin satellite nodule biopsy positive   04/28/2023 -  Neo-Adjuvant Chemotherapy   Neoadjuvant chemotherapy with Taxol carboplatin (every 3 weeks x 4) Keytruda  followed  by Adriamycin  Cytoxan  Keytruda  (Dr. Selinda Vicenta Albino at [facility]): AC-K cycle 1 given on 08/01/2022 at [facility] (echo 04/25/2023: EF 55 to 60%, strain -18.5%)   08/04/2023 Cancer Staging   Staging form: Breast, AJCC 8th Edition - Clinical: Stage IIIC (cT4b, cN0, cM0, G3, ER-, PR-, HER2-) - Signed by Odean Potts, MD on 08/04/2023 Stage prefix: Initial diagnosis Histologic grading system: 3 grade system   08/04/2023 -  Chemotherapy   Patient is on Treatment Plan : BREAST Pembrolizumab  (200) D1 + Carboplatin (5) D1 + Paclitaxel (80) D1,8,15 q21d X 4 cycles / Pembrolizumab  (200) D1 + AC D1 q21d x 4 cycles        MEDICAL HISTORY:  Past Medical History:  Diagnosis Date   Back pain    DDD (degenerative disc disease), lumbosacral    DDD (degenerative disc disease), thoracic    DDD (degenerative disc disease), thoracolumbar     SURGICAL HISTORY: Past Surgical History:  Procedure Laterality Date   CHOLECYSTECTOMY      SOCIAL HISTORY: Social History   Socioeconomic History   Marital status: Married    Spouse name: Not on file   Number of children: Not on file   Years of education: Not on file   Highest education level: Not on file  Occupational History   Not on file  Tobacco Use   Smoking status: Every Day    Current packs/day: 0.50    Types: Cigarettes   Smokeless tobacco: Never  Vaping Use   Vaping status: Never Used  Substance and Sexual Activity   Alcohol use: Yes    Comment: occ   Drug use: No   Sexual activity:  Not on file  Other Topics Concern   Not on file  Social History Narrative   Not on file   Social Drivers of Health   Financial Resource Strain: Not on file  Food Insecurity: Low Risk  (06/01/2023)   Received from [facility]   Hunger Vital Sign    Worried About Running Out of Food in the Last Year: Never true    Ran Out of Food in the Last Year: Never true  Transportation Needs: No Transportation Needs (06/01/2023)   Received from [employer]    In the past 12 months, has lack of reliable transportation kept you from medical appointments, meetings, work or from getting things needed for daily living? : No  Physical Activity: Not on file  Stress: Not on file  Social Connections: Not on file  Intimate Partner Violence: Not on file    FAMILY HISTORY: No family history on file.  ALLERGIES:  has no known allergies.  MEDICATIONS:  Current Outpatient Medications  Medication Sig Dispense Refill   lidocaine  (LIDODERM ) 5 % Place 1 patch onto the skin daily. Remove & Discard patch within 12 hours or as directed by MD 30 patch 0   predniSONE  (STERAPRED UNI-PAK 21 TAB) 10 MG (21) TBPK tablet Take by mouth daily. Take 6 tabs by mouth daily  for 2 days, then 5 tabs for 2 days, then 4 tabs for 2 days, then 3 tabs for 2 days, 2 tabs for 2 days, then 1 tab by mouth daily for 2 days 42 tablet 0   No current facility-administered medications for this visit.    REVIEW OF SYSTEMS:   Constitutional: Denies fevers, chills or abnormal night sweats   All other systems were reviewed with the patient and are negative.  PHYSICAL EXAMINATION: ECOG PERFORMANCE STATUS: 1 - Symptomatic but completely ambulatory  Vitals:   08/04/23 0908  BP: 127/71  Pulse: (!) 101  Resp: 18  Temp: 98.5 F (36.9 C)  SpO2: 100%   Filed Weights   08/04/23 0908  Weight: 168 lb 12.8 oz (76.6 kg)    GENERAL:alert, no distress and comfortable     ASSESSMENT AND PLAN:  Malignant neoplasm of lower-outer quadrant of right breast of female, estrogen receptor negative (HCC) 02/22/2023: [facility]: Palpable mass, mammogram detected right breast mass 5 cm from the nipple 1.6 cm, separate oval circumscribed hypoechoic mass 7 mm (?  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%,  skin satellite nodule biopsy positive  Treatment plan: Neoadjuvant chemotherapy with Taxol carbo Keytruda  every 3 weeks x 4 (completed at [facility]), received first cycle of Adriamycin  Cytoxan  Keytruda  on 08/01/2022: Plan to complete keynote 522 regimen with cycle 2 Adriamycin  Cytoxan  Keytruda  to be given 08/23/2023, followed by Keytruda  maintenance Followed by breast conserving surgery with targeted node dissection Adjuvant radiation therapy ----------------------------------------------------------------------------------------------------------------------------------------------------- Chemo toxicities: Alopecia Severe pain in the left hip: Much improved Pain in her hands also much improved  07/27/2023: Ultrasound and mammogram: Right breast mass increased from 1.6 cm to 3.3 cm, decrease in the intramammary lymph node and right axillary lymph node We were able to administer Neulasta  today since this is day 3. Return to clinic for cycle 2 of Adriamycin  Cytoxan  Keytruda  on 08/23/2023. Assessment and Plan    Stage 3B Triple Negative Breast Cancer Patient has completed  four rounds of Taxol, Carboplatin, and Keytruda , and one round of Adriamycin , Cytoxan , and Keytruda . Noted side effects of fatigue and previous hand swelling and pain. A skin nodule was excised. Patient has reported inconsistent care and lack of communication from previous provider. -Continue Adriamycin , Cytoxan , and Keytruda  for three more rounds. -Ensure patient receives Neulasta  shot to boost white blood cell count, either at our facility or previous provider's office. -Plan for MRI post-chemotherapy and discuss surgical options with patient, including potential bilateral mastectomy. -Consider continuation of Keytruda  for one year post-surgery for immune system support.  Financial and Social Support Patient has reported financial difficulties and lack of support during treatment. -Refer to child psychotherapist for assistance and  resources.         All questions were answered. The patient knows to call the clinic with any problems, questions or concerns.    Viinay K Driana Dazey, MD 08/04/23

## 2023-08-04 NOTE — Assessment & Plan Note (Addendum)
 02/22/2023: [facility]: Palpable mass, mammogram detected right breast mass 5 cm from the nipple 1.6 cm, separate oval circumscribed hypoechoic mass 7 mm (?  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%, skin satellite nodule biopsy positive  Treatment plan: Neoadjuvant chemotherapy with Taxol carbo Keytruda  every 3 weeks x 4 (completed at [facility]), received first cycle of Adriamycin  Cytoxan  Keytruda  on 08/01/2022: Plan to complete keynote 522 regimen with cycle 2 Adriamycin  Cytoxan  Keytruda  to be given 08/23/2023, followed by Keytruda  maintenance Followed by breast conserving surgery with targeted node dissection Adjuvant radiation therapy ----------------------------------------------------------------------------------------------------------------------------------------------------- Chemo toxicities:  07/27/2023: Ultrasound and mammogram: Right breast mass increased from 1.6 cm to 3.3 cm, decrease in the intramammary lymph node and right axillary lymph node  Return to clinic for cycle 2 of Adriamycin  Cytoxan  Keytruda  on 08/23/2023.

## 2023-08-04 NOTE — Progress Notes (Signed)
 START ON PATHWAY REGIMEN - Breast     Cycles 1 through 4: A cycle is every 21 days:     Pembrolizumab       Paclitaxel      Carboplatin      Filgrastim-xxxx    Cycles 5 through 8: A cycle is every 21 days:     Pembrolizumab       Doxorubicin       Cyclophosphamide       Pegfilgrastim -xxxx   **Always confirm dose/schedule in your pharmacy ordering system**  Patient Characteristics: Preoperative or Nonsurgical Candidate, M0 (Clinical Staging), Up to cT4c, Any N, M0, Neoadjuvant Therapy followed by Surgery, Invasive Disease, Chemotherapy, HER2 Negative, ER Negative, Platinum Therapy Indicated and Candidate for Checkpoint Inhibitor Therapeutic Status: Preoperative or Nonsurgical Candidate, M0 (Clinical Staging) AJCC M Category: cM0 AJCC Grade: G3 ER Status: Negative (-) AJCC 8 Stage Grouping: IIIC HER2 Status: Negative (-) AJCC T Category: cT4b AJCC N Category: cN0 PR Status: Negative (-) Breast Surgical Plan: Neoadjuvant Therapy followed by Surgery Intent of Therapy: Curative Intent, Discussed with Patient

## 2023-08-05 ENCOUNTER — Other Ambulatory Visit: Payer: Self-pay

## 2023-08-08 ENCOUNTER — Other Ambulatory Visit: Payer: Self-pay | Admitting: *Deleted

## 2023-08-08 ENCOUNTER — Encounter: Payer: Self-pay | Admitting: *Deleted

## 2023-08-08 MED ORDER — GABAPENTIN 100 MG PO CAPS: 100.0000 mg | ORAL_CAPSULE | Freq: Three times a day (TID) | ORAL | 0 refills | Status: DC

## 2023-08-08 MED ORDER — PROCHLORPERAZINE MALEATE 10 MG PO TABS: 10.0000 mg | ORAL_TABLET | Freq: Four times a day (QID) | ORAL | 0 refills | Status: DC | PRN

## 2023-08-08 MED ORDER — ONDANSETRON HCL 8 MG PO TABS: 8.0000 mg | ORAL_TABLET | Freq: Three times a day (TID) | ORAL | 1 refills | Status: DC | PRN

## 2023-08-08 NOTE — Progress Notes (Signed)
 Received call from pt with complaint of ongoing numbness and tingling in bilateral feet.  RN reviewed with MD and verbal orders received for pt to be prescribed Gabapentin  100 mg p.o TID.  If Gabapentin  100 mg TID causes drowsiness, pt states pt to take Gabapentin  300 mg p.o at bedtime.  Pt educated and verbalized understanding.

## 2023-08-09 ENCOUNTER — Inpatient Hospital Stay: Payer: Medicaid Other | Admitting: Licensed Clinical Social Worker

## 2023-08-09 NOTE — Progress Notes (Signed)
 [facility] CSW Progress Note  Clinical Social Worker contacted patient by phone to follow-up on resources discussed on 08/04/23. 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 CSW reminded pt of everything needed and about [employer] and LIEAP programs.  Sent information via e-mail to pt per her request.     Sherrod Toothman E Namya Voges, LCSW Clinical Social Worker [facility]    Patient is participating in a Managed Medicaid Plan:  Yes

## 2023-08-11 ENCOUNTER — Encounter: Payer: Self-pay | Admitting: Licensed Clinical Social Worker

## 2023-08-11 NOTE — Progress Notes (Signed)
[facility] CSW Progress Note  Patient dropped of documents for application at front desk today. CSW spoke with pt by phone and confirmed receipt. Erika-mailed list of what else is still needed.  Pt also has an appt with an examiner for social security on 08/15/23.    Erika Lopez Erika Celsey Asselin, LCSW Clinical Social Worker [facility]    Patient is participating in a Managed Medicaid Plan:  Yes

## 2023-08-14 ENCOUNTER — Encounter: Payer: Self-pay | Admitting: Licensed Clinical Social Worker

## 2023-08-14 NOTE — Progress Notes (Signed)
[facility] CSW Progress Note  Clinical Social Worker submitted [employer] application on pt's behalf as discussed with patient.    Erika Lopez E Danner Paulding, LCSW Clinical Social Worker [facility]    Patient is participating in a Managed Medicaid Plan:  Yes

## 2023-08-17 ENCOUNTER — Encounter: Payer: Self-pay | Admitting: Licensed Clinical Social Worker

## 2023-08-17 NOTE — Progress Notes (Signed)
[facility] CSW Progress Note  Clinical Child psychotherapist received response from [employer] that patient is ineligible for funding as she received assistance from them in October 2024 and pt's can only receive assistance once during a twelve-month period.    Erika Slinger E Kutler Vanvranken, LCSW Clinical Social Worker [facility]    Patient is participating in a Managed Medicaid Plan:  Yes

## 2023-08-21 ENCOUNTER — Encounter: Payer: Self-pay | Admitting: Hematology and Oncology

## 2023-08-21 ENCOUNTER — Telehealth: Payer: Self-pay | Admitting: Licensed Clinical Social Worker

## 2023-08-21 NOTE — Progress Notes (Signed)
Called pt to introduce myself as her Dance movement psychotherapist and to discuss the [employer].  Pt would like to apply so I emailed her an expense sheet and since she's currently not receiving income she will provide a letter of support and proof of SNAP benefits on 08/23/23.  Once received I will approve her for the grant.

## 2023-08-21 NOTE — Telephone Encounter (Signed)
Pt called to clarify documents still needed for [employer] application. Clarified that we need any bills pt wants considered for payment and that pt can bring them to appt on 08/23/23.   Giulianna Rocha E Rakeen Gaillard, LCSW

## 2023-08-22 MED FILL — Fosaprepitant Dimeglumine For IV Infusion 150 MG (Base Eq): INTRAVENOUS | Qty: 5 | Status: AC

## 2023-08-23 ENCOUNTER — Inpatient Hospital Stay: Payer: Medicaid Other

## 2023-08-23 ENCOUNTER — Inpatient Hospital Stay: Payer: Medicaid Other | Admitting: Licensed Clinical Social Worker

## 2023-08-23 ENCOUNTER — Inpatient Hospital Stay (HOSPITAL_BASED_OUTPATIENT_CLINIC_OR_DEPARTMENT_OTHER): Payer: Medicaid Other | Admitting: Hematology and Oncology

## 2023-08-23 ENCOUNTER — Encounter: Payer: Self-pay | Admitting: Hematology and Oncology

## 2023-08-23 VITALS — BP 148/80 | HR 115 | Temp 98.2°F | Resp 18 | Ht 68.0 in | Wt 165.7 lb

## 2023-08-23 DIAGNOSIS — Z171 Estrogen receptor negative status [ER-]: Secondary | ICD-10-CM

## 2023-08-23 DIAGNOSIS — Z95828 Presence of other vascular implants and grafts: Secondary | ICD-10-CM | POA: Insufficient documentation

## 2023-08-23 DIAGNOSIS — C50511 Malignant neoplasm of lower-outer quadrant of right female breast: Secondary | ICD-10-CM

## 2023-08-23 LAB — CBC WITH DIFFERENTIAL (CANCER CENTER ONLY)
Abs Immature Granulocytes: 0.05 10*3/uL (ref 0.00–0.07)
Basophils Absolute: 0 10*3/uL (ref 0.0–0.1)
Basophils Relative: 1 %
Eosinophils Absolute: 0.1 10*3/uL (ref 0.0–0.5)
Eosinophils Relative: 2 %
HCT: 30.6 % — ABNORMAL LOW (ref 36.0–46.0)
Hemoglobin: 10.3 g/dL — ABNORMAL LOW (ref 12.0–15.0)
Immature Granulocytes: 1 %
Lymphocytes Relative: 23 %
Lymphs Abs: 1.4 10*3/uL (ref 0.7–4.0)
MCH: 28.2 pg (ref 26.0–34.0)
MCHC: 33.7 g/dL (ref 30.0–36.0)
MCV: 83.8 fL (ref 80.0–100.0)
Monocytes Absolute: 1.4 10*3/uL — ABNORMAL HIGH (ref 0.1–1.0)
Monocytes Relative: 24 %
Neutro Abs: 3 10*3/uL (ref 1.7–7.7)
Neutrophils Relative %: 49 %
Platelet Count: 462 10*3/uL — ABNORMAL HIGH (ref 150–400)
RBC: 3.65 MIL/uL — ABNORMAL LOW (ref 3.87–5.11)
RDW: 14.8 % (ref 11.5–15.5)
WBC Count: 5.9 10*3/uL (ref 4.0–10.5)
nRBC: 0 % (ref 0.0–0.2)

## 2023-08-23 LAB — CMP (CANCER CENTER ONLY)
ALT: 17 U/L (ref 0–44)
AST: 18 U/L (ref 15–41)
Albumin: 3.9 g/dL (ref 3.5–5.0)
Alkaline Phosphatase: 75 U/L (ref 38–126)
Anion gap: 7 (ref 5–15)
BUN: 7 mg/dL (ref 6–20)
CO2: 28 mmol/L (ref 22–32)
Calcium: 9.2 mg/dL (ref 8.9–10.3)
Chloride: 101 mmol/L (ref 98–111)
Creatinine: 0.57 mg/dL (ref 0.44–1.00)
GFR, Estimated: >60 mL/min (ref >60)
Glucose, Bld: 116 mg/dL — ABNORMAL HIGH (ref 70–99)
Potassium: 3.6 mmol/L (ref 3.5–5.1)
Sodium: 136 mmol/L (ref 135–145)
Total Bilirubin: 0.3 mg/dL (ref 0.0–1.2)
Total Protein: 7.3 g/dL (ref 6.5–8.1)

## 2023-08-23 LAB — TSH: TSH: 1.059 u[IU]/mL (ref 0.350–4.500)

## 2023-08-23 MED ORDER — SODIUM CHLORIDE 0.9 % IV SOLN
600.0000 mg/m2 | Freq: Once | INTRAVENOUS | Status: AC
  Administered 2023-08-23: 1160 mg via INTRAVENOUS
  Filled 2023-08-23: qty 58

## 2023-08-23 MED ORDER — HEPARIN SOD (PORK) LOCK FLUSH 100 UNIT/ML IV SOLN
500.0000 [IU] | Freq: Once | INTRAVENOUS | Status: AC | PRN
  Administered 2023-08-23: 500 [IU]

## 2023-08-23 MED ORDER — DEXAMETHASONE SODIUM PHOSPHATE 10 MG/ML IJ SOLN
10.0000 mg | Freq: Once | INTRAMUSCULAR | Status: AC
Start: 2023-08-23 — End: 2023-08-23
  Administered 2023-08-23: 10 mg via INTRAVENOUS
  Filled 2023-08-23: qty 1

## 2023-08-23 MED ORDER — DOXORUBICIN HCL CHEMO IV INJECTION 2 MG/ML
60.0000 mg/m2 | Freq: Once | INTRAVENOUS | Status: DC
Start: 2023-08-23 — End: 2023-08-23
  Filled 2023-08-23 (×2): qty 58

## 2023-08-23 MED ORDER — SODIUM CHLORIDE 0.9% FLUSH
10.0000 mL | INTRAVENOUS | Status: DC | PRN
  Administered 2023-08-23: 10 mL

## 2023-08-23 MED ORDER — SODIUM CHLORIDE 0.9% FLUSH
10.0000 mL | Freq: Once | INTRAVENOUS | Status: AC
  Administered 2023-08-23: 10 mL

## 2023-08-23 MED ORDER — SODIUM CHLORIDE 0.9 % IV SOLN: INTRAVENOUS | Status: DC

## 2023-08-23 MED ORDER — PALONOSETRON HCL INJECTION 0.25 MG/5ML
0.2500 mg | Freq: Once | INTRAVENOUS | Status: AC
Start: 2023-08-23 — End: 2023-08-23
  Administered 2023-08-23: 0.25 mg via INTRAVENOUS
  Filled 2023-08-23: qty 5

## 2023-08-23 MED ORDER — SODIUM CHLORIDE 0.9 % IV SOLN
200.0000 mg | Freq: Once | INTRAVENOUS | Status: AC
  Administered 2023-08-23: 200 mg via INTRAVENOUS
  Filled 2023-08-23: qty 200

## 2023-08-23 MED ORDER — SODIUM CHLORIDE 0.9 % IV SOLN
150.0000 mg | Freq: Once | INTRAVENOUS | Status: AC
  Administered 2023-08-23: 150 mg via INTRAVENOUS
  Filled 2023-08-23: qty 150

## 2023-08-23 MED ORDER — DOXORUBICIN HCL CHEMO IV INJECTION 2 MG/ML
60.0000 mg/m2 | Freq: Once | INTRAVENOUS | Status: AC
Start: 2023-08-23 — End: 2023-08-23
  Administered 2023-08-23: 116 mg via INTRAVENOUS
  Filled 2023-08-23: qty 58

## 2023-08-23 NOTE — Progress Notes (Signed)
Pt is approved for the $1000 [employer].

## 2023-08-23 NOTE — Progress Notes (Signed)
Patient Care Team: Patient, No Pcp Per as PCP - General (General Practice)  DIAGNOSIS:  Encounter Diagnosis  Name Primary?   Malignant neoplasm of lower-outer quadrant of right breast of female, estrogen receptor negative (HCC) Yes    SUMMARY OF ONCOLOGIC HISTORY: Oncology History  Malignant neoplasm of lower-outer quadrant of right breast of female, estrogen receptor negative (HCC)  02/22/2023 Initial Diagnosis   [facility]: Palpable mass, mammogram detected right breast mass 5 cm from the nipple 1.6 cm, separate oval circumscribed hypoechoic mass 7 mm (?  Intramammary lymph node: Benign) grouped amorphous calcifications UOQ left breast 7 mm (benign), right axillary lymph node biopsy negative Biopsy: Poorly differentiated IDC ER less than 1%, PR less than 1%, HER2 0, Ki-67 90%, skin satellite nodule biopsy positive   04/28/2023 -  Neo-Adjuvant Chemotherapy   Neoadjuvant chemotherapy with Taxol carboplatin (every 3 weeks x 4) Keytruda followed by Adriamycin Cytoxan Keytruda (Dr. Leonie Green at [facility]): AC-K cycle 1 given on 08/01/2022 at [facility] (echo 04/25/2023: EF 55 to 60%, strain -18.5%)   08/04/2023 Cancer Staging   Staging form: Breast, AJCC 8th Edition - Clinical: Stage IIIC (cT4b, cN0, cM0, G3, ER-, PR-, HER2-) - Signed by Serena Croissant, MD on 08/04/2023 Stage prefix: Initial diagnosis Histologic grading system: 3 grade system   08/04/2023 -  Chemotherapy   Patient is on Treatment Plan : BREAST Pembrolizumab (200) D1 + Carboplatin (5) D1 + Paclitaxel (80) D1,8,15 q21d X 4 cycles / Pembrolizumab (200) D1 + AC D1 q21d x 4 cycles       CHIEF COMPLIANT: Cycle 2 Adriamycin Cytoxan Keytruda  HISTORY OF PRESENT ILLNESS:   History of Present Illness   The patient presents for follow-up regarding ongoing cancer treatment.  The patient is currently undergoing chemotherapy for cancer treatment, with two more sessions remaining. She experiences pain described as deeper but  reports no skin changes. Recent laboratory results show slight anemia with a hemoglobin level of 10.3. Her white blood cell count has normalized following a recent injection, and she is scheduled for another injection two days after her next chemotherapy session.  She is experiencing significant emotional distress related to personal circumstances, including an impending move from her apartment. She feels overwhelmed, cries frequently, and expresses a lack of support, quoting her father, 'You can't make somebody's day don't mess it up.'         ALLERGIES:  has no known allergies.  MEDICATIONS:  Current Outpatient Medications  Medication Sig Dispense Refill   gabapentin (NEURONTIN) 100 MG capsule Take 1 capsule (100 mg total) by mouth 3 (three) times daily. 90 capsule 0   lidocaine (LIDODERM) 5 % Place 1 patch onto the skin daily. Remove & Discard patch within 12 hours or as directed by MD 30 patch 0   ondansetron (ZOFRAN) 8 MG tablet Take 1 tablet (8 mg total) by mouth every 8 (eight) hours as needed for nausea or vomiting. 30 tablet 1   predniSONE (STERAPRED UNI-PAK 21 TAB) 10 MG (21) TBPK tablet Take by mouth daily. Take 6 tabs by mouth daily  for 2 days, then 5 tabs for 2 days, then 4 tabs for 2 days, then 3 tabs for 2 days, 2 tabs for 2 days, then 1 tab by mouth daily for 2 days 42 tablet 0   prochlorperazine (COMPAZINE) 10 MG tablet Take 1 tablet (10 mg total) by mouth every 6 (six) hours as needed for nausea or vomiting. 30 tablet 0   No current  facility-administered medications for this visit.    PHYSICAL EXAMINATION: ECOG PERFORMANCE STATUS: 1 - Symptomatic but completely ambulatory  Vitals:   08/23/23 0918  BP: (!) 148/80  Pulse: (!) 115  Resp: 18  Temp: 98.2 F (36.8 C)  SpO2: 100%   Filed Weights   08/23/23 0918  Weight: 165 lb 11.2 oz (75.2 kg)      LABORATORY DATA:  I have reviewed the data as listed    Latest Ref Rng & Units 08/23/2023    8:50 AM  CMP   Glucose 70 - 99 mg/dL 161   BUN 6 - 20 mg/dL 7   Creatinine 0.96 - 0.45 mg/dL 4.09   Sodium 811 - 914 mmol/L 136   Potassium 3.5 - 5.1 mmol/L 3.6   Chloride 98 - 111 mmol/L 101   CO2 22 - 32 mmol/L 28   Calcium 8.9 - 10.3 mg/dL 9.2   Total Protein 6.5 - 8.1 g/dL 7.3   Total Bilirubin 0.0 - 1.2 mg/dL 0.3   Alkaline Phos 38 - 126 U/L 75   AST 15 - 41 U/L 18   ALT 0 - 44 U/L 17     Lab Results  Component Value Date   WBC 5.9 08/23/2023   HGB 10.3 (L) 08/23/2023   HCT 30.6 (L) 08/23/2023   MCV 83.8 08/23/2023   PLT 462 (H) 08/23/2023   NEUTROABS 3.0 08/23/2023    ASSESSMENT & PLAN:  Malignant neoplasm of lower-outer quadrant of right breast of female, estrogen receptor negative (HCC) 02/22/2023: [facility]: Palpable mass, mammogram detected right breast mass 5 cm from the nipple 1.6 cm, separate oval circumscribed hypoechoic mass 7 mm (?  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%, skin satellite nodule biopsy positive   Treatment plan: Neoadjuvant chemotherapy with Taxol Harrell Gave every 3 weeks x 4 (completed at [facility]), received first cycle of Adriamycin Cytoxan Keytruda on 08/01/2022: Plan to complete keynote 522 regimen with cycle 2 Adriamycin Cytoxan Keytruda to be given 08/23/2023, followed by Keytruda maintenance Followed by breast conserving surgery with targeted node dissection Adjuvant radiation therapy ----------------------------------------------------------------------------------------------------------------------------------------------------- Current Treatment: Cycle 2 AC-Keytruda Chemo toxicities: Alopecia Severe pain in the left hip: Much improved Pain in her hands also much improved   07/27/2023: Ultrasound and mammogram: Right breast mass increased from 1.6 cm to 3.3  cm, decrease in the intramammary lymph node and right axillary lymph node  RTC in 3 weeks for cycle 3 ------------------------------------- Assessment and Plan    Breast Cancer Undergoing chemotherapy with two treatments remaining, last scheduled for October 04, 2023. Reports pain, no skin changes. White blood cell count normalized, slight anemia with hemoglobin 10.3, acceptable at this stage. Discussed surgical options and imaging post-chemotherapy. Informed consent obtained for continued chemotherapy and post-treatment imaging. - Schedule MRI of the breast on October 05, 2023 - Arrange consultation with a surgeon for post-chemotherapy surgical planning - Administer injection two days after each chemotherapy session, next on August 25, 2023 at 10 AM  Anemia Slight anemia with hemoglobin 10.3, acceptable at this stage of chemotherapy treatment. - Monitor hemoglobin levels regularly  Psychosocial Stress Experiencing significant psychosocial stress due to housing instability and lack of social support. Reports crying frequently, feeling overwhelmed, and minimal support from friends or family. - Encourage seeking support from church members or community resources - Provide  emotional support and reassurance during visits  Follow-up - Schedule follow-up appointment for injection on August 25, 2023 at 10 AM - Schedule MRI of the breast on October 05, 2023 - Arrange consultation with a surgeon post-chemotherapy.          Orders Placed This Encounter  Procedures   MR BREAST BILATERAL W WO CONTRAST INC CAD    Standing Status:   Future    Expected Date:   10/05/2023    Expiration Date:   08/22/2024    If indicated for the ordered procedure, I authorize the administration of contrast media per Radiology protocol:   Yes    What is the patient's sedation requirement?:   No Sedation    Does the patient have a pacemaker or implanted devices?:   No    Preferred imaging location?:   [facility] (table limit - 550 lbs)    Release to patient:   Immediate   The patient has a good understanding of the overall plan. she agrees with it. she will call with any problems that may develop before the next visit here. Total time spent: 30 mins including face to face time and time spent for planning, charting and co-ordination of care   Tamsen Meek, MD 08/23/23

## 2023-08-23 NOTE — Progress Notes (Signed)
[facility] CSW Progress Note  Visual merchandiser met with patient to work on application to the [employer]. Pt shared that she is being evicted and will be staying with a friend for now. She has been stressed over all of this and trying to get her things together. CSW provided empathic listening. Completed [employer] application today and patient brought in a bill to include. Pt will gather additional bills (car loan, insurance, phone) to submit with application. Bag provided from [employer].  Pt was also approved for the [employer].    Montae Stager E Raphel Stickles, LCSW Clinical Social Worker [facility]    Patient is participating in a Managed Medicaid Plan:  Yes

## 2023-08-23 NOTE — Progress Notes (Signed)
Per Dr. Pamelia Hoit, ok to treat with elevated HR.

## 2023-08-23 NOTE — Assessment & Plan Note (Signed)
02/22/2023: [facility]: Palpable mass, mammogram detected right breast mass 5 cm from the nipple 1.6 cm, separate oval circumscribed hypoechoic mass 7 mm (?  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%, skin satellite nodule biopsy positive   Treatment plan: Neoadjuvant chemotherapy with Taxol Harrell Gave every 3 weeks x 4 (completed at [facility]), received first cycle of Adriamycin Cytoxan Keytruda on 08/01/2022: Plan to complete keynote 522 regimen with cycle 2 Adriamycin Cytoxan Keytruda to be given 08/23/2023, followed by Keytruda maintenance Followed by breast conserving surgery with targeted node dissection Adjuvant radiation therapy ----------------------------------------------------------------------------------------------------------------------------------------------------- Current Treatment: Cycle 2 AC-Keytruda Chemo toxicities: Alopecia Severe pain in the left hip: Much improved Pain in her hands also much improved   07/27/2023: Ultrasound and mammogram: Right breast mass increased from 1.6 cm to 3.3 cm, decrease in the intramammary lymph node and right axillary lymph node  RTC in 3 weeks for cycle 3

## 2023-08-23 NOTE — Patient Instructions (Signed)
[facility] - A DEPT OF [facility]  Discharge Instructions: Thank you for choosing [facility] to provide your oncology and hematology care.   If you have a lab appointment with the Cancer Center, please go directly to the Cancer Center and check in at the registration area.   Wear comfortable clothing and clothing appropriate for easy access to any Portacath or PICC line.   We strive to give you quality time with your provider. You may need to reschedule your appointment if you arrive late (15 or more minutes).  Arriving late affects you and other patients whose appointments are after yours.  Also, if you miss three or more appointments without notifying the office, you may be dismissed from the clinic at the provider's discretion.      For prescription refill requests, have your pharmacy contact our office and allow 72 hours for refills to be completed.    Today you received the following chemotherapy and/or immunotherapy agents keytruda, adriamycin, cytoxan      To help prevent nausea and vomiting after your treatment, we encourage you to take your nausea medication as directed.  BELOW ARE SYMPTOMS THAT SHOULD BE REPORTED IMMEDIATELY: *FEVER GREATER THAN 100.4 F (38 C) OR HIGHER *CHILLS OR SWEATING *NAUSEA AND VOMITING THAT IS NOT CONTROLLED WITH YOUR NAUSEA MEDICATION *UNUSUAL SHORTNESS OF BREATH *UNUSUAL BRUISING OR BLEEDING *URINARY PROBLEMS (pain or burning when urinating, or frequent urination) *BOWEL PROBLEMS (unusual diarrhea, constipation, pain near the anus) TENDERNESS IN MOUTH AND THROAT WITH OR WITHOUT PRESENCE OF ULCERS (sore throat, sores in mouth, or a toothache) UNUSUAL RASH, SWELLING OR PAIN  UNUSUAL VAGINAL DISCHARGE OR ITCHING   Items with * indicate a potential emergency and should be followed up as soon as possible or go to the Emergency Department if any problems should occur.  Please show the CHEMOTHERAPY ALERT  CARD or IMMUNOTHERAPY ALERT CARD at check-in to the Emergency Department and triage nurse.  Should you have questions after your visit or need to cancel or reschedule your appointment, please contact [facility] - A DEPT OF [facility]  Dept: [phone]  and follow the prompts.  Office hours are 8:00 a.m. to 4:30 p.m. Monday - Friday. Please note that voicemails left after 4:00 p.m. may not be returned until the following business day.  We are closed weekends and major holidays. You have access to a nurse at all times for urgent questions. Please call the main number to the clinic Dept: [phone] and follow the prompts.   For any non-urgent questions, you may also contact your provider using MyChart. We now offer e-Visits for anyone 76 and older to request care online for non-urgent symptoms. For details visit [email or web address].   Also download the MyChart app! Go to the app store, search "MyChart", open the app, select [place], and log in with your MyChart username and password.

## 2023-08-24 ENCOUNTER — Encounter: Payer: Self-pay | Admitting: *Deleted

## 2023-08-24 LAB — T4: T4, Total: 7.8 ug/dL (ref 4.5–12.0)

## 2023-08-25 ENCOUNTER — Inpatient Hospital Stay: Payer: Medicaid Other

## 2023-08-25 ENCOUNTER — Other Ambulatory Visit: Payer: Self-pay

## 2023-08-25 DIAGNOSIS — C50511 Malignant neoplasm of lower-outer quadrant of right female breast: Secondary | ICD-10-CM

## 2023-08-25 MED ORDER — PEGFILGRASTIM-CBQV 6 MG/0.6ML ~~LOC~~ SOSY
6.0000 mg | PREFILLED_SYRINGE | Freq: Once | SUBCUTANEOUS | Status: AC
  Administered 2023-08-25: 6 mg via SUBCUTANEOUS
  Filled 2023-08-25: qty 0.6

## 2023-08-26 ENCOUNTER — Other Ambulatory Visit: Payer: Self-pay

## 2023-08-29 ENCOUNTER — Other Ambulatory Visit: Payer: Self-pay

## 2023-08-30 ENCOUNTER — Encounter: Payer: Self-pay | Admitting: *Deleted

## 2023-08-30 ENCOUNTER — Other Ambulatory Visit: Payer: Self-pay

## 2023-09-05 ENCOUNTER — Other Ambulatory Visit (HOSPITAL_BASED_OUTPATIENT_CLINIC_OR_DEPARTMENT_OTHER): Payer: Self-pay

## 2023-09-05 ENCOUNTER — Other Ambulatory Visit: Payer: Self-pay

## 2023-09-05 ENCOUNTER — Encounter: Payer: Self-pay | Admitting: Hematology and Oncology

## 2023-09-05 ENCOUNTER — Encounter (HOSPITAL_BASED_OUTPATIENT_CLINIC_OR_DEPARTMENT_OTHER): Payer: Self-pay | Admitting: Emergency Medicine

## 2023-09-05 ENCOUNTER — Emergency Department (HOSPITAL_BASED_OUTPATIENT_CLINIC_OR_DEPARTMENT_OTHER)
Admission: EM | Admit: 2023-09-05 | Discharge: 2023-09-05 | Disposition: A | Discharge status: Home / Self Care | Payer: Medicaid Other | Attending: Emergency Medicine | Admitting: Emergency Medicine

## 2023-09-05 DIAGNOSIS — M5441 Lumbago with sciatica, right side: Secondary | ICD-10-CM | POA: Diagnosis not present

## 2023-09-05 DIAGNOSIS — M545 Low back pain, unspecified: Secondary | ICD-10-CM | POA: Diagnosis present

## 2023-09-05 DIAGNOSIS — M5442 Lumbago with sciatica, left side: Secondary | ICD-10-CM | POA: Diagnosis not present

## 2023-09-05 DIAGNOSIS — Z853 Personal history of malignant neoplasm of breast: Secondary | ICD-10-CM | POA: Diagnosis not present

## 2023-09-05 MED ORDER — KETOROLAC TROMETHAMINE 60 MG/2ML IM SOLN
60.0000 mg | Freq: Once | INTRAMUSCULAR | Status: AC
  Administered 2023-09-05: 60 mg via INTRAMUSCULAR
  Filled 2023-09-05: qty 2

## 2023-09-05 MED ORDER — PREDNISONE 20 MG PO TABS
40.0000 mg | ORAL_TABLET | Freq: Once | ORAL | Status: AC
Start: 2023-09-05 — End: 2023-09-05
  Administered 2023-09-05: 40 mg via ORAL
  Filled 2023-09-05: qty 2

## 2023-09-05 MED ORDER — OXYCODONE-ACETAMINOPHEN 5-325 MG PO TABS: 2.0000 | ORAL_TABLET | ORAL | 0 refills | Status: DC | PRN

## 2023-09-05 MED ORDER — PREDNISONE 10 MG PO TABS
20.0000 mg | ORAL_TABLET | Freq: Two times a day (BID) | ORAL | 0 refills | Status: DC
  Filled 2023-09-05: qty 20, 5d supply, fill #0

## 2023-09-05 MED ORDER — HYDROMORPHONE HCL 1 MG/ML IJ SOLN
2.0000 mg | Freq: Once | INTRAMUSCULAR | Status: AC
  Administered 2023-09-05: 2 mg via INTRAMUSCULAR
  Filled 2023-09-05: qty 2

## 2023-09-05 MED ORDER — OXYCODONE-ACETAMINOPHEN 5-325 MG PO TABS
1.0000 | ORAL_TABLET | Freq: Four times a day (QID) | ORAL | 0 refills | Status: DC | PRN
  Filled 2023-09-05: qty 20, 3d supply, fill #0

## 2023-09-05 NOTE — ED Triage Notes (Signed)
Pt reports she has "disc diease" meaning she suffers lower back pain and "every now and then it flairs up."  She stated the pain woke her up.

## 2023-09-05 NOTE — ED Provider Notes (Signed)
[place] [facility] Provider Note   CSN: [id number] Arrival date & time: 09/05/23  2536     History  Chief Complaint  Patient presents with   Back Pain    Erika Lopez is a 58 y.o. female.  Patient is a 58 year old female with past medical history of degenerative disc disease, breast cancer.  Patient presenting today with complaints of back pain.  This flared up yesterday in the absence of any injury or trauma.  She denies any bowel or bladder incontinence or weakness in her legs.  The pain does radiate down her legs.  Pain worse with movement.  No alleviating factors.  The history is provided by the patient.       Home Medications Prior to Admission medications   Medication Sig Start Date End Date Taking? Authorizing Provider  gabapentin (NEURONTIN) 100 MG capsule Take 1 capsule (100 mg total) by mouth 3 (three) times daily. 08/08/23   Serena Croissant, MD  lidocaine (LIDODERM) 5 % Place 1 patch onto the skin daily. Remove & Discard patch within 12 hours or as directed by MD 05/31/20   Caccavale, Sophia, PA-C  ondansetron (ZOFRAN) 8 MG tablet Take 1 tablet (8 mg total) by mouth every 8 (eight) hours as needed for nausea or vomiting. 08/08/23   Serena Croissant, MD  predniSONE (STERAPRED UNI-PAK 21 TAB) 10 MG (21) TBPK tablet Take by mouth daily. Take 6 tabs by mouth daily  for 2 days, then 5 tabs for 2 days, then 4 tabs for 2 days, then 3 tabs for 2 days, 2 tabs for 2 days, then 1 tab by mouth daily for 2 days 05/31/20   Caccavale, Sophia, PA-C  prochlorperazine (COMPAZINE) 10 MG tablet Take 1 tablet (10 mg total) by mouth every 6 (six) hours as needed for nausea or vomiting. 08/08/23   Serena Croissant, MD      Allergies    Patient has no known allergies.    Review of Systems   Review of Systems  All other systems reviewed and are negative.   Physical Exam Updated Vital Signs BP 124/73 (BP Location: Left Arm)   Pulse (!) 115   Temp 98.3 F (36.8  C) (Oral)   Resp (!) 22   Ht 5\' 10"  (1.778 m)   Wt 77.1 kg   SpO2 100%   BMI 24.39 kg/m  Physical Exam Vitals and nursing note reviewed.  Constitutional:      Appearance: Normal appearance.  HENT:     Head: Normocephalic and atraumatic.  Pulmonary:     Effort: Pulmonary effort is normal.  Musculoskeletal:     Comments: There is tenderness to palpation in the soft tissues of the lower lumbar region.  No bony tenderness or step-off.  Skin:    General: Skin is warm and dry.  Neurological:     Mental Status: She is alert.     ED Results / Procedures / Treatments   Labs (all labs ordered are listed, but only abnormal results are displayed) Labs Reviewed - No data to display  EKG None  Radiology No results found.  Procedures Procedures    Medications Ordered in ED Medications  ketorolac (TORADOL) injection 60 mg (has no administration in time range)  HYDROmorphone (DILAUDID) injection 2 mg (has no administration in time range)  predniSONE (DELTASONE) tablet 40 mg (has no administration in time range)    ED Course/ Medical Decision Making/ A&P  Patient presenting with back pain as described  in the HPI.  Patient arrives with stable vital signs and is febrile.  Patient given IM Dilaudid and Toradol.  She was given oral prednisone.  She will be discharged with oxycodone and prednisone and follow-up as needed.  No red flags that would suggest an emergent situation.  Final Clinical Impression(s) / ED Diagnoses Final diagnoses:  None    Rx / DC Orders ED Discharge Orders     None         Geoffery Lyons, MD 09/05/23 [phone]

## 2023-09-05 NOTE — Discharge Instructions (Addendum)
Begin taking prednisone as prescribed.  Begin taking Percocet as prescribed as needed for pain.  Follow-up with primary doctor if symptoms are not improving in the next week.

## 2023-09-06 ENCOUNTER — Other Ambulatory Visit (HOSPITAL_BASED_OUTPATIENT_CLINIC_OR_DEPARTMENT_OTHER): Payer: Self-pay

## 2023-09-06 DIAGNOSIS — C50511 Malignant neoplasm of lower-outer quadrant of right female breast: Secondary | ICD-10-CM | POA: Diagnosis not present

## 2023-09-06 DIAGNOSIS — Z171 Estrogen receptor negative status [ER-]: Secondary | ICD-10-CM | POA: Diagnosis not present

## 2023-09-14 MED FILL — Fosaprepitant Dimeglumine For IV Infusion 150 MG (Base Eq): INTRAVENOUS | Qty: 5 | Status: AC

## 2023-09-15 ENCOUNTER — Inpatient Hospital Stay: Payer: Medicaid Other

## 2023-09-15 ENCOUNTER — Inpatient Hospital Stay (HOSPITAL_BASED_OUTPATIENT_CLINIC_OR_DEPARTMENT_OTHER): Payer: Medicaid Other | Admitting: Adult Health

## 2023-09-15 ENCOUNTER — Inpatient Hospital Stay: Payer: Medicaid Other | Attending: Hematology and Oncology

## 2023-09-15 ENCOUNTER — Encounter: Payer: Self-pay | Admitting: Adult Health

## 2023-09-15 VITALS — BP 132/75 | HR 85 | Resp 16

## 2023-09-15 VITALS — BP 131/65 | HR 95 | Temp 97.5°F | Resp 16 | Wt 172.1 lb

## 2023-09-15 DIAGNOSIS — Z171 Estrogen receptor negative status [ER-]: Secondary | ICD-10-CM

## 2023-09-15 DIAGNOSIS — F17210 Nicotine dependence, cigarettes, uncomplicated: Secondary | ICD-10-CM | POA: Diagnosis not present

## 2023-09-15 DIAGNOSIS — C50511 Malignant neoplasm of lower-outer quadrant of right female breast: Secondary | ICD-10-CM | POA: Diagnosis not present

## 2023-09-15 DIAGNOSIS — M549 Dorsalgia, unspecified: Secondary | ICD-10-CM | POA: Diagnosis not present

## 2023-09-15 DIAGNOSIS — K5900 Constipation, unspecified: Secondary | ICD-10-CM | POA: Diagnosis not present

## 2023-09-15 DIAGNOSIS — Z95828 Presence of other vascular implants and grafts: Secondary | ICD-10-CM

## 2023-09-15 DIAGNOSIS — Z5189 Encounter for other specified aftercare: Secondary | ICD-10-CM | POA: Insufficient documentation

## 2023-09-15 LAB — CMP (CANCER CENTER ONLY)
ALT: 19 U/L (ref 0–44)
AST: 12 U/L — ABNORMAL LOW (ref 15–41)
Albumin: 3.9 g/dL (ref 3.5–5.0)
Alkaline Phosphatase: 75 U/L (ref 38–126)
Anion gap: 4 — ABNORMAL LOW (ref 5–15)
BUN: 7 mg/dL (ref 6–20)
CO2: 31 mmol/L (ref 22–32)
Calcium: 8.9 mg/dL (ref 8.9–10.3)
Chloride: 104 mmol/L (ref 98–111)
Creatinine: 0.57 mg/dL (ref 0.44–1.00)
GFR, Estimated: >60 mL/min (ref >60)
Glucose, Bld: 113 mg/dL — ABNORMAL HIGH (ref 70–99)
Potassium: 3.6 mmol/L (ref 3.5–5.1)
Sodium: 139 mmol/L (ref 135–145)
Total Bilirubin: 0.3 mg/dL (ref 0.0–1.2)
Total Protein: 6.5 g/dL (ref 6.5–8.1)

## 2023-09-15 LAB — CBC WITH DIFFERENTIAL (CANCER CENTER ONLY)
Abs Immature Granulocytes: 0.07 10*3/uL (ref 0.00–0.07)
Basophils Absolute: 0.1 10*3/uL (ref 0.0–0.1)
Basophils Relative: 0 %
Eosinophils Absolute: 0 10*3/uL (ref 0.0–0.5)
Eosinophils Relative: 0 %
HCT: 31.3 % — ABNORMAL LOW (ref 36.0–46.0)
Hemoglobin: 10.3 g/dL — ABNORMAL LOW (ref 12.0–15.0)
Immature Granulocytes: 1 %
Lymphocytes Relative: 15 %
Lymphs Abs: 1.7 10*3/uL (ref 0.7–4.0)
MCH: 28.6 pg (ref 26.0–34.0)
MCHC: 32.9 g/dL (ref 30.0–36.0)
MCV: 86.9 fL (ref 80.0–100.0)
Monocytes Absolute: 1.7 10*3/uL — ABNORMAL HIGH (ref 0.1–1.0)
Monocytes Relative: 15 %
Neutro Abs: 7.7 10*3/uL (ref 1.7–7.7)
Neutrophils Relative %: 69 %
Platelet Count: 339 10*3/uL (ref 150–400)
RBC: 3.6 MIL/uL — ABNORMAL LOW (ref 3.87–5.11)
RDW: 16.6 % — ABNORMAL HIGH (ref 11.5–15.5)
WBC Count: 11.3 10*3/uL — ABNORMAL HIGH (ref 4.0–10.5)
nRBC: 0.3 % — ABNORMAL HIGH (ref 0.0–0.2)

## 2023-09-15 MED ORDER — DOXORUBICIN HCL CHEMO IV INJECTION 2 MG/ML
60.0000 mg/m2 | Freq: Once | INTRAVENOUS | Status: AC
  Administered 2023-09-15: 116 mg via INTRAVENOUS
  Filled 2023-09-15: qty 58

## 2023-09-15 MED ORDER — DEXAMETHASONE SODIUM PHOSPHATE 10 MG/ML IJ SOLN
10.0000 mg | Freq: Once | INTRAMUSCULAR | Status: AC
  Administered 2023-09-15: 10 mg via INTRAVENOUS
  Filled 2023-09-15: qty 1

## 2023-09-15 MED ORDER — SODIUM CHLORIDE 0.9 % IV SOLN
200.0000 mg | Freq: Once | INTRAVENOUS | Status: AC
  Administered 2023-09-15: 200 mg via INTRAVENOUS
  Filled 2023-09-15: qty 200

## 2023-09-15 MED ORDER — SODIUM CHLORIDE 0.9 % IV SOLN: INTRAVENOUS | Status: DC

## 2023-09-15 MED ORDER — OXYCODONE-ACETAMINOPHEN 5-325 MG PO TABS: 1.0000 | ORAL_TABLET | Freq: Four times a day (QID) | ORAL | 0 refills | Status: DC | PRN

## 2023-09-15 MED ORDER — FOSAPREPITANT DIMEGLUMINE INJECTION 150 MG
150.0000 mg | Freq: Once | INTRAVENOUS | Status: AC
  Administered 2023-09-15: 150 mg via INTRAVENOUS
  Filled 2023-09-15: qty 150

## 2023-09-15 MED ORDER — PALONOSETRON HCL INJECTION 0.25 MG/5ML
0.2500 mg | Freq: Once | INTRAVENOUS | Status: AC
  Administered 2023-09-15: 0.25 mg via INTRAVENOUS
  Filled 2023-09-15: qty 5

## 2023-09-15 MED ORDER — SODIUM CHLORIDE 0.9% FLUSH
10.0000 mL | INTRAVENOUS | Status: DC | PRN
  Administered 2023-09-15: 10 mL

## 2023-09-15 MED ORDER — SODIUM CHLORIDE 0.9 % IV SOLN
600.0000 mg/m2 | Freq: Once | INTRAVENOUS | Status: AC
  Administered 2023-09-15: 1160 mg via INTRAVENOUS
  Filled 2023-09-15: qty 58

## 2023-09-15 MED ORDER — HEPARIN SOD (PORK) LOCK FLUSH 100 UNIT/ML IV SOLN
500.0000 [IU] | Freq: Once | INTRAVENOUS | Status: AC | PRN
Start: 2023-09-15 — End: 2023-09-15
  Administered 2023-09-15: 500 [IU]

## 2023-09-15 MED ORDER — SODIUM CHLORIDE 0.9% FLUSH
10.0000 mL | Freq: Once | INTRAVENOUS | Status: AC
  Administered 2023-09-15: 10 mL

## 2023-09-15 NOTE — Progress Notes (Signed)
 [facility] Cancer Follow up:    Patient, No Pcp Per No address on file   DIAGNOSIS:  Cancer Staging  Malignant neoplasm of lower-outer quadrant of right breast of female, estrogen receptor negative (HCC) Staging form: Breast, AJCC 8th Edition - Clinical: Stage IIIC (cT4b, cN0, cM0, G3, ER-, PR-, HER2-) - Signed by Serena Croissant, MD on 08/04/2023 Stage prefix: Initial diagnosis Histologic grading system: 3 grade system   SUMMARY OF ONCOLOGIC HISTORY: Oncology History  Malignant neoplasm of lower-outer quadrant of right breast of female, estrogen receptor negative (HCC)  02/22/2023 Initial Diagnosis   [facility]: Palpable mass, mammogram detected right breast mass 5 cm from the nipple 1.6 cm, separate oval circumscribed hypoechoic mass 7 mm (?  Intramammary lymph node: Benign) grouped amorphous calcifications UOQ left breast 7 mm (benign), right axillary lymph node biopsy negative Biopsy: Poorly differentiated IDC ER less than 1%, PR less than 1%, HER2 0, Ki-67 90%, skin satellite nodule biopsy positive   04/28/2023 -  Neo-Adjuvant Chemotherapy   Neoadjuvant chemotherapy with Taxol carboplatin (every 3 weeks x 4) Keytruda followed by Adriamycin Cytoxan Keytruda (Dr. Leonie Green at [facility]): AC-K cycle 1 given on 08/01/2022 at [facility] (echo 04/25/2023: EF 55 to 60%, strain -18.5%)   08/04/2023 Cancer Staging   Staging form: Breast, AJCC 8th Edition - Clinical: Stage IIIC (cT4b, cN0, cM0, G3, ER-, PR-, HER2-) - Signed by Serena Croissant, MD on 08/04/2023 Stage prefix: Initial diagnosis Histologic grading system: 3 grade system   08/04/2023 -  Chemotherapy   Patient is on Treatment Plan : BREAST Pembrolizumab (200) D1 + Carboplatin (5) D1 + Paclitaxel (80) D1,8,15 q21d X 4 cycles / Pembrolizumab (200) D1 + AC D1 q21d x 4 cycles       CURRENT THERAPY: adriamycin, cytoxan, keytruda  INTERVAL HISTORY:  Discussed the use of AI scribe software for clinical note transcription  with the patient, who [name] to proceed.  Erika Lopez 58 y.o. female returns for f/u of Adriamycin/Cytoxan and Keytruda.   She presents with joint pain and constipation. She reports that the joint pain is severe, particularly in the shoulder, and feels like 'somebody going to bust me in my back.' She attributes this pain to her [facility] treatment. She also reports a recent episode of severe constipation lasting fifteen days, which she managed with laxatives and dietary changes. She describes this episode as 'scary' and expresses relief that it has resolved.  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 Despite these side effects, she expresses gratitude for being alive and a determination to continue her treatment.  She also expresses frustration with her previous oncologist, who she felt was dismissive of her concerns (non [facility] oncologist).  She is grateful to have connected with Dr. Pamelia Hoit.    Patient Active Problem List   Diagnosis Date Noted   Port-A-Cath in place 08/23/2023   Malignant neoplasm of lower-outer quadrant of right breast of female, estrogen receptor negative (HCC) 08/04/2023   Inflammatory arthritis 06/17/2023   Low back pain 11/25/2019   Adjustment disorder with disturbance of conduct 01/02/2015   Cigarette smoker 08/06/2013   Cholelithiasis 07/12/2013   Abdominal pain 07/08/2013    has no known allergies.  MEDICAL HISTORY: Past Medical History:  Diagnosis Date   Back pain    DDD (degenerative disc disease), lumbosacral    DDD (degenerative disc disease), thoracic    DDD (degenerative disc disease), thoracolumbar  SURGICAL HISTORY: Past Surgical History:  Procedure Laterality Date   CHOLECYSTECTOMY      SOCIAL HISTORY: Social History   Socioeconomic History   Marital status: Legally Separated    Spouse name: Not on file   Number of children: Not on file   Years  of education: Not on file   Highest education level: Not on file  Occupational History   Not on file  Tobacco Use   Smoking status: Every Day    Current packs/day: 0.50    Types: Cigarettes   Smokeless tobacco: Never  Vaping Use   Vaping status: Never Used  Substance and Sexual Activity   Alcohol use: Yes    Comment: occ   Drug use: No   Sexual activity: Not on file  Other Topics Concern   Not on file  Social History Narrative   Not on file   Social Drivers of Health   Financial Resource Strain: Not on file  Food Insecurity: Food Insecurity Present (08/23/2023)   Hunger Vital Sign    Worried About Running Out of Food in the Last Year: Often true    Ran Out of Food in the Last Year: Sometimes true  Transportation Needs: No Transportation Needs (08/04/2023)   PRAPARE - Administrator, Civil Service (Medical): No    Lack of Transportation (Non-Medical): No  Physical Activity: Not on file  Stress: Not on file  Social Connections: Not on file  Intimate Partner Violence: Not on file    FAMILY HISTORY: History reviewed. No pertinent family history.  Review of Systems  Constitutional:  Negative for appetite change, chills, fatigue, fever and unexpected weight change.  HENT:   Negative for hearing loss, lump/mass and trouble swallowing.   Eyes:  Negative for eye problems and icterus.  Respiratory:  Negative for chest tightness, cough and shortness of breath.   Cardiovascular:  Negative for chest pain, leg swelling and palpitations.  Gastrointestinal:  Positive for constipation. Negative for abdominal distention, abdominal pain, diarrhea, nausea and vomiting.  Endocrine: Negative for hot flashes.  Genitourinary:  Negative for difficulty urinating.   Musculoskeletal:  Positive for arthralgias.  Skin:  Negative for itching and rash.  Neurological:  Negative for dizziness, extremity weakness, headaches and numbness.  Hematological:  Negative for adenopathy. Does not  bruise/bleed easily.  Psychiatric/Behavioral:  Negative for depression. The patient is not nervous/anxious.       PHYSICAL EXAMINATION    Vitals:   09/15/23 0832  BP: 131/65  Pulse: 95  Resp: 16  Temp: (!) 97.5 F (36.4 C)  SpO2: 100%    Physical Exam Constitutional:      General: She is not in acute distress.    Appearance: Normal appearance. She is not toxic-appearing.  HENT:     Head: Normocephalic and atraumatic.     Mouth/Throat:     Mouth: Mucous membranes are moist.     Pharynx: Oropharynx is clear. No oropharyngeal exudate or posterior oropharyngeal erythema.  Eyes:     General: No scleral icterus. Cardiovascular:     Rate and Rhythm: Normal rate and regular rhythm.     Pulses: Normal pulses.     Heart sounds: Normal heart sounds.  Pulmonary:     Effort: Pulmonary effort is normal.     Breath sounds: Normal breath sounds.  Abdominal:     General: Abdomen is flat. Bowel sounds are normal. There is no distension.     Palpations: Abdomen is soft.  Tenderness: There is no abdominal tenderness.  Musculoskeletal:        General: No swelling.     Cervical back: Neck supple.  Lymphadenopathy:     Cervical: No cervical adenopathy.  Skin:    General: Skin is warm and dry.     Findings: No rash.  Neurological:     General: No focal deficit present.     Mental Status: She is alert.  Psychiatric:        Mood and Affect: Mood normal.        Behavior: Behavior normal.     LABORATORY DATA:  CBC    Component Value Date/Time   WBC 11.3 (H) 09/15/2023 0758   RBC 3.60 (L) 09/15/2023 0758   HGB 10.3 (L) 09/15/2023 0758   HCT 31.3 (L) 09/15/2023 0758   PLT 339 09/15/2023 0758   MCV 86.9 09/15/2023 0758   MCH 28.6 09/15/2023 0758   MCHC 32.9 09/15/2023 0758   RDW 16.6 (H) 09/15/2023 0758   LYMPHSABS 1.7 09/15/2023 0758   MONOABS 1.7 (H) 09/15/2023 0758   EOSABS 0.0 09/15/2023 0758   BASOSABS 0.1 09/15/2023 0758    CMP     Component Value Date/Time    NA 139 09/15/2023 0758   K 3.6 09/15/2023 0758   CL 104 09/15/2023 0758   CO2 31 09/15/2023 0758   GLUCOSE 113 (H) 09/15/2023 0758   BUN 7 09/15/2023 0758   CREATININE 0.57 09/15/2023 0758   CALCIUM 8.9 09/15/2023 0758   PROT 6.5 09/15/2023 0758   ALBUMIN 3.9 09/15/2023 0758   AST 12 (L) 09/15/2023 0758   ALT 19 09/15/2023 0758   ALKPHOS 75 09/15/2023 0758   BILITOT 0.3 09/15/2023 0758   GFRNONAA >60 09/15/2023 0758        ASSESSMENT and THERAPY PLAN:   Malignant neoplasm of lower-outer quadrant of right breast of female, estrogen receptor negative (HCC) 02/22/2023: [facility]: Palpable mass, mammogram detected right breast mass 5 cm from the nipple 1.6 cm, separate oval circumscribed hypoechoic mass 7 mm (?  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%, skin satellite nodule biopsy positive   Treatment plan: Neoadjuvant chemotherapy with Taxol Erika Lopez every 3 weeks x 4 (completed at [facility]), received first cycle of Adriamycin Cytoxan Keytruda on 08/01/2022: Plan to complete keynote 522 regimen with cycle 2 Adriamycin Cytoxan Keytruda to be given 08/23/2023, followed by Keytruda maintenance Followed by breast conserving surgery with targeted node dissection Adjuvant radiation therapy 07/27/2023: Ultrasound and mammogram: Right breast mass increased from 1.6 cm to 3.3 cm, decrease in the intramammary lymph node and right axillary lymph node ----------------------------------------------------------------------------------------------------------------------------------------------------- Current Treatment: Cycle 3 AC-Keytruda Chemo toxicities: Alopecia Severe pain in the left hip: Much improved Arthralgias improved Constipation-resolved with bowel regimen   Cancer Treatment Side  Effects Joint pain and neuropathy reported, likely secondary to [facility]. Constipation experienced after last treatment, resolved with laxatives and dietary changes. -Continue current management strategies for side effects. -Refill Percocet prescription for pain management.  Neutropenia Prevention Receiving Neulasta injections post-chemotherapy to stimulate white blood cell production and prevent neutropenia. Reports significant sleepiness and bone pain post-injection. -Continue Neulasta injections as scheduled.  Cancer Progress Reports feeling that the tumor is smaller. Previous dissatisfaction with care and progression from stage 1 to stage 3 under previous provider. Now under care of Dr. Georgiann Mohs. -  Continue current treatment plan. -Schedule final infusion. -Check-in post-infusion for assessment of response to treatment.  All questions were answered. The patient knows to call the clinic with any problems, questions or concerns. We can certainly see the patient much sooner if necessary.  Total encounter time:30 minutes*in face-to-face visit time, chart review, lab review, care coordination, order entry, and documentation of the encounter time.    Lillard Anes, NP 09/19/23 11:49 AM Medical Oncology and Hematology [facility] [address] [place], [place] [address] Tel. [phone]    Fax. [phone]  *Total Encounter Time as defined by the Centers for Medicare and Medicaid Services includes, in addition to the face-to-face time of a patient visit (documented in the note above) non-face-to-face time: obtaining and reviewing outside history, ordering and reviewing medications, tests or procedures, care coordination (communications with other health care professionals or caregivers) and documentation in the medical record.

## 2023-09-15 NOTE — Assessment & Plan Note (Addendum)
 02/22/2023: [facility]: Palpable mass, mammogram detected right breast mass 5 cm from the nipple 1.6 cm, separate oval circumscribed hypoechoic mass 7 mm (?  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%, skin satellite nodule biopsy positive   Treatment plan: Neoadjuvant chemotherapy with Taxol Harrell Gave every 3 weeks x 4 (completed at [facility]), received first cycle of Adriamycin Cytoxan Keytruda on 08/01/2022: Plan to complete keynote 522 regimen with cycle 2 Adriamycin Cytoxan Keytruda to be given 08/23/2023, followed by Keytruda maintenance Followed by breast conserving surgery with targeted node dissection Adjuvant radiation therapy 07/27/2023: Ultrasound and mammogram: Right breast mass increased from 1.6 cm to 3.3 cm, decrease in the intramammary lymph node and right axillary lymph node ----------------------------------------------------------------------------------------------------------------------------------------------------- Current Treatment: Cycle 3 AC-Keytruda Chemo toxicities: Alopecia Severe pain in the left hip: Much improved Arthralgias improved Constipation-resolved with bowel regimen   Cancer Treatment Side Effects Joint pain and neuropathy reported, likely secondary to [facility]. Constipation experienced after last treatment, resolved with laxatives and dietary changes. -Continue current management strategies for side effects. -Refill Percocet prescription for pain management.  Neutropenia Prevention Receiving Neulasta injections post-chemotherapy to stimulate white blood cell production and prevent neutropenia. Reports significant sleepiness and bone pain post-injection. -Continue Neulasta injections as scheduled.  Cancer Progress Reports feeling that the tumor is smaller. Previous  dissatisfaction with care and progression from stage 1 to stage 3 under previous provider. Now under care of Dr. Georgiann Mohs. -Continue current treatment plan. -Schedule final infusion. -Check-in post-infusion for assessment of response to treatment.

## 2023-09-15 NOTE — Patient Instructions (Signed)
 [facility] - A DEPT OF [facility]  Discharge Instructions: Thank you for choosing [facility] to provide your oncology and hematology care.   If you have a lab appointment with the Cancer Center, please go directly to the Cancer Center and check in at the registration area.   Wear comfortable clothing and clothing appropriate for easy access to any Portacath or PICC line.   We strive to give you quality time with your provider. You may need to reschedule your appointment if you arrive late (15 or more minutes).  Arriving late affects you and other patients whose appointments are after yours.  Also, if you miss three or more appointments without notifying the office, you may be dismissed from the clinic at the provider's discretion.      For prescription refill requests, have your pharmacy contact our office and allow 72 hours for refills to be completed.    Today you received the following chemotherapy and/or immunotherapy agents: Keytruda/Adriamycin/Cytoxan      To help prevent nausea and vomiting after your treatment, we encourage you to take your nausea medication as directed.  BELOW ARE SYMPTOMS THAT SHOULD BE REPORTED IMMEDIATELY: *FEVER GREATER THAN 100.4 F (38 C) OR HIGHER *CHILLS OR SWEATING *NAUSEA AND VOMITING THAT IS NOT CONTROLLED WITH YOUR NAUSEA MEDICATION *UNUSUAL SHORTNESS OF BREATH *UNUSUAL BRUISING OR BLEEDING *URINARY PROBLEMS (pain or burning when urinating, or frequent urination) *BOWEL PROBLEMS (unusual diarrhea, constipation, pain near the anus) TENDERNESS IN MOUTH AND THROAT WITH OR WITHOUT PRESENCE OF ULCERS (sore throat, sores in mouth, or a toothache) UNUSUAL RASH, SWELLING OR PAIN  UNUSUAL VAGINAL DISCHARGE OR ITCHING   Items with * indicate a potential emergency and should be followed up as soon as possible or go to the Emergency Department if any problems should occur.  Please show the CHEMOTHERAPY ALERT CARD  or IMMUNOTHERAPY ALERT CARD at check-in to the Emergency Department and triage nurse.  Should you have questions after your visit or need to cancel or reschedule your appointment, please contact [facility] - A DEPT OF [facility]  Dept: [phone]  and follow the prompts.  Office hours are 8:00 a.m. to 4:30 p.m. Monday - Friday. Please note that voicemails left after 4:00 p.m. may not be returned until the following business day.  We are closed weekends and major holidays. You have access to a nurse at all times for urgent questions. Please call the main number to the clinic Dept: [phone] and follow the prompts.   For any non-urgent questions, you may also contact your provider using MyChart. We now offer e-Visits for anyone 37 and older to request care online for non-urgent symptoms. For details visit [email or web address].   Also download the MyChart app! Go to the app store, search "MyChart", open the app, select [facility], and log in with your MyChart username and password.

## 2023-09-18 ENCOUNTER — Inpatient Hospital Stay: Payer: Medicaid Other

## 2023-09-18 VITALS — BP 124/70 | HR 95 | Temp 98.4°F | Resp 18

## 2023-09-18 DIAGNOSIS — C50511 Malignant neoplasm of lower-outer quadrant of right female breast: Secondary | ICD-10-CM

## 2023-09-18 MED ORDER — PEGFILGRASTIM-CBQV 6 MG/0.6ML ~~LOC~~ SOSY
6.0000 mg | PREFILLED_SYRINGE | Freq: Once | SUBCUTANEOUS | Status: AC
Start: 2023-09-18 — End: 2023-09-18
  Administered 2023-09-18: 6 mg via SUBCUTANEOUS
  Filled 2023-09-18: qty 0.6

## 2023-09-19 ENCOUNTER — Telehealth: Payer: Self-pay | Admitting: *Deleted

## 2023-09-19 ENCOUNTER — Other Ambulatory Visit: Payer: Self-pay | Admitting: *Deleted

## 2023-09-19 ENCOUNTER — Telehealth: Payer: Self-pay | Admitting: Hematology and Oncology

## 2023-09-19 ENCOUNTER — Encounter: Payer: Self-pay | Admitting: Hematology and Oncology

## 2023-09-19 MED ORDER — FLUCONAZOLE 100 MG PO TABS: 100.0000 mg | ORAL_TABLET | Freq: Every day | ORAL | 0 refills | Status: DC

## 2023-09-19 NOTE — Telephone Encounter (Signed)
 Called both lines and no answer at either, wanted to let patient know we moved her MRI, I wasn't able to leave a voicemail either

## 2023-09-19 NOTE — Telephone Encounter (Signed)
 Scheduled appointments per WQ. Patient is aware of the made appointments.

## 2023-09-19 NOTE — Progress Notes (Signed)
 Received call from pt with complaint of white coating on her tongue causing her to not want to eat due to an unpleasant taste. Pt states she is able to hydrate with water.  Pt also denies any oral sores at this time.  RN reviewed with MD and verbal orders received for pt to be prescribed Diflucan 100 mg p.o tablet daily x 5 days.  Prescription sent to pharmacy on file.  Pt educated and verbalized understanding.

## 2023-09-20 ENCOUNTER — Other Ambulatory Visit: Payer: Self-pay

## 2023-09-20 ENCOUNTER — Encounter: Payer: Self-pay | Admitting: *Deleted

## 2023-09-28 ENCOUNTER — Encounter: Payer: Self-pay | Admitting: *Deleted

## 2023-10-04 ENCOUNTER — Telehealth: Payer: Self-pay | Admitting: *Deleted

## 2023-10-04 ENCOUNTER — Encounter: Payer: Self-pay | Admitting: Hematology and Oncology

## 2023-10-04 ENCOUNTER — Inpatient Hospital Stay: Attending: Hematology and Oncology | Admitting: Licensed Clinical Social Worker

## 2023-10-04 DIAGNOSIS — K1379 Other lesions of oral mucosa: Secondary | ICD-10-CM | POA: Insufficient documentation

## 2023-10-04 DIAGNOSIS — R630 Anorexia: Secondary | ICD-10-CM | POA: Insufficient documentation

## 2023-10-04 DIAGNOSIS — Z5111 Encounter for antineoplastic chemotherapy: Secondary | ICD-10-CM | POA: Insufficient documentation

## 2023-10-04 DIAGNOSIS — Z923 Personal history of irradiation: Secondary | ICD-10-CM | POA: Insufficient documentation

## 2023-10-04 DIAGNOSIS — R000 Tachycardia, unspecified: Secondary | ICD-10-CM | POA: Insufficient documentation

## 2023-10-04 DIAGNOSIS — Z9221 Personal history of antineoplastic chemotherapy: Secondary | ICD-10-CM | POA: Insufficient documentation

## 2023-10-04 DIAGNOSIS — Z5948 Other specified lack of adequate food: Secondary | ICD-10-CM | POA: Insufficient documentation

## 2023-10-04 DIAGNOSIS — C50511 Malignant neoplasm of lower-outer quadrant of right female breast: Secondary | ICD-10-CM | POA: Insufficient documentation

## 2023-10-04 DIAGNOSIS — Z7952 Long term (current) use of systemic steroids: Secondary | ICD-10-CM | POA: Insufficient documentation

## 2023-10-04 DIAGNOSIS — Z171 Estrogen receptor negative status [ER-]: Secondary | ICD-10-CM | POA: Insufficient documentation

## 2023-10-04 DIAGNOSIS — D649 Anemia, unspecified: Secondary | ICD-10-CM | POA: Insufficient documentation

## 2023-10-04 MED FILL — Fosaprepitant Dimeglumine For IV Infusion 150 MG (Base Eq): INTRAVENOUS | Qty: 5 | Status: AC

## 2023-10-04 NOTE — Telephone Encounter (Signed)
"  Erika Lopez, [phone] (home) calling.  Received your number from LCSW.  Did not receive notification to renew short-term disability initiated by [facility] who dropped the ball.  Disability has expired so I did not receive a disability check yesterday.  Receiving chemotherapy, have not had surgery yet.  Have not worked at [employer] since September 2024.  [facility] needs appointment information; dates I will need to be out of work after surgery and I do not remember what else.  Love my job.  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  Request renewal of STD and/or initiation of LTD.  Send forms addressed to our legal name '[place]'.  Fax to short-term disability, long-term disability to or fax number [phone], for processing.  [place] HIPAA Authorization is required to share PHI with third parties.  When registering for tomorrow's appointment ask for an authorization form for paperwork for [place].  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 Denies further questions or needs.  Awaiting [facility] paperwork and authorization form.

## 2023-10-04 NOTE — Assessment & Plan Note (Signed)
 02/22/2023: [facility]: Palpable mass, mammogram detected right breast mass 5 cm from the nipple 1.6 cm, separate oval circumscribed hypoechoic mass 7 mm (?  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%, skin satellite nodule biopsy positive   Treatment plan: Neoadjuvant chemotherapy with Taxol Harrell Gave every 3 weeks x 4 (completed at [facility]), received first cycle of Adriamycin Cytoxan Keytruda on 08/01/2022: Plan to complete keynote 522 regimen with cycle 2 Adriamycin Cytoxan Keytruda to be given 08/23/2023, followed by Keytruda maintenance Followed by breast conserving surgery with targeted node dissection Adjuvant radiation therapy ----------------------------------------------------------------------------------------------------------------------------------------------------- Current Treatment: Cycle 3 AC-Keytruda Chemo toxicities: Alopecia Severe pain in the left hip: Much improved Pain in her hands also much improved   07/27/2023: Ultrasound and mammogram: Right breast mass increased from 1.6 cm to 3.3 cm, decrease in the intramammary lymph node and right axillary lymph node   RTC in 3 weeks for cycle 4

## 2023-10-04 NOTE — Progress Notes (Signed)
 [facility] CSW Progress Note  Clinical Child psychotherapist contacted patient by phone after receiving a VM from patient regarding her short term disability. Per pt, she did not receive her most recent expected disability check. She spoke with [facility] who stated they need updated information on treatment dates and plan.  CSW provided contact information for our FMLA.short term disability team and also sent them a message with pt's information.    Patient expressed that she is tired out doing anything as her heart beats fast lately. Offered to connect pt with RN, but pt declined at this time. Pt is ready to have last treatment on Thursday and hopefully start feeling better.    Erika Lopez E Ahtziri Jeffries, LCSW Clinical Social Worker [facility]    Patient is participating in a Managed Medicaid Plan:  Yes

## 2023-10-05 ENCOUNTER — Other Ambulatory Visit (HOSPITAL_COMMUNITY): Payer: Medicaid Other

## 2023-10-05 ENCOUNTER — Inpatient Hospital Stay (HOSPITAL_BASED_OUTPATIENT_CLINIC_OR_DEPARTMENT_OTHER): Payer: Medicaid Other | Admitting: Hematology and Oncology

## 2023-10-05 ENCOUNTER — Inpatient Hospital Stay: Payer: Medicaid Other

## 2023-10-05 VITALS — BP 136/81 | HR 95 | Temp 98.3°F | Resp 16

## 2023-10-05 VITALS — BP 133/66 | HR 115 | Temp 97.6°F | Resp 17 | Ht 70.0 in | Wt 163.6 lb

## 2023-10-05 DIAGNOSIS — Z9221 Personal history of antineoplastic chemotherapy: Secondary | ICD-10-CM | POA: Diagnosis not present

## 2023-10-05 DIAGNOSIS — D649 Anemia, unspecified: Secondary | ICD-10-CM | POA: Diagnosis not present

## 2023-10-05 DIAGNOSIS — Z95828 Presence of other vascular implants and grafts: Secondary | ICD-10-CM

## 2023-10-05 DIAGNOSIS — Z5111 Encounter for antineoplastic chemotherapy: Secondary | ICD-10-CM | POA: Diagnosis present

## 2023-10-05 DIAGNOSIS — C50511 Malignant neoplasm of lower-outer quadrant of right female breast: Secondary | ICD-10-CM

## 2023-10-05 DIAGNOSIS — Z171 Estrogen receptor negative status [ER-]: Secondary | ICD-10-CM | POA: Diagnosis not present

## 2023-10-05 DIAGNOSIS — Z5948 Other specified lack of adequate food: Secondary | ICD-10-CM | POA: Diagnosis not present

## 2023-10-05 DIAGNOSIS — R630 Anorexia: Secondary | ICD-10-CM | POA: Diagnosis not present

## 2023-10-05 DIAGNOSIS — R000 Tachycardia, unspecified: Secondary | ICD-10-CM | POA: Diagnosis not present

## 2023-10-05 DIAGNOSIS — Z923 Personal history of irradiation: Secondary | ICD-10-CM | POA: Diagnosis not present

## 2023-10-05 DIAGNOSIS — Z7952 Long term (current) use of systemic steroids: Secondary | ICD-10-CM | POA: Diagnosis not present

## 2023-10-05 DIAGNOSIS — K1379 Other lesions of oral mucosa: Secondary | ICD-10-CM | POA: Diagnosis not present

## 2023-10-05 LAB — CBC WITH DIFFERENTIAL (CANCER CENTER ONLY)
Abs Immature Granulocytes: 0.07 10*3/uL (ref 0.00–0.07)
Basophils Absolute: 0.1 10*3/uL (ref 0.0–0.1)
Basophils Relative: 1 %
Eosinophils Absolute: 0 10*3/uL (ref 0.0–0.5)
Eosinophils Relative: 0 %
HCT: 30 % — ABNORMAL LOW (ref 36.0–46.0)
Hemoglobin: 10.1 g/dL — ABNORMAL LOW (ref 12.0–15.0)
Immature Granulocytes: 1 %
Lymphocytes Relative: 17 %
Lymphs Abs: 1.4 10*3/uL (ref 0.7–4.0)
MCH: 28.3 pg (ref 26.0–34.0)
MCHC: 33.7 g/dL (ref 30.0–36.0)
MCV: 84 fL (ref 80.0–100.0)
Monocytes Absolute: 1.3 10*3/uL — ABNORMAL HIGH (ref 0.1–1.0)
Monocytes Relative: 16 %
Neutro Abs: 5.3 10*3/uL (ref 1.7–7.7)
Neutrophils Relative %: 65 %
Platelet Count: 378 10*3/uL (ref 150–400)
RBC: 3.57 MIL/uL — ABNORMAL LOW (ref 3.87–5.11)
RDW: 15.4 % (ref 11.5–15.5)
WBC Count: 8 10*3/uL (ref 4.0–10.5)
nRBC: 0 % (ref 0.0–0.2)

## 2023-10-05 LAB — CMP (CANCER CENTER ONLY)
ALT: 23 U/L (ref 0–44)
AST: 22 U/L (ref 15–41)
Albumin: 3.9 g/dL (ref 3.5–5.0)
Alkaline Phosphatase: 83 U/L (ref 38–126)
Anion gap: 5 (ref 5–15)
BUN: 6 mg/dL (ref 6–20)
CO2: 30 mmol/L (ref 22–32)
Calcium: 9 mg/dL (ref 8.9–10.3)
Chloride: 103 mmol/L (ref 98–111)
Creatinine: 0.5 mg/dL (ref 0.44–1.00)
GFR, Estimated: >60 mL/min (ref >60)
Glucose, Bld: 102 mg/dL — ABNORMAL HIGH (ref 70–99)
Potassium: 3.8 mmol/L (ref 3.5–5.1)
Sodium: 138 mmol/L (ref 135–145)
Total Bilirubin: 0.3 mg/dL (ref 0.0–1.2)
Total Protein: 7.4 g/dL (ref 6.5–8.1)

## 2023-10-05 MED ORDER — SODIUM CHLORIDE 0.9% FLUSH
10.0000 mL | Freq: Once | INTRAVENOUS | Status: AC
  Administered 2023-10-05: 10 mL

## 2023-10-05 MED ORDER — SODIUM CHLORIDE 0.9 % IV SOLN
150.0000 mg | Freq: Once | INTRAVENOUS | Status: AC
  Administered 2023-10-05: 150 mg via INTRAVENOUS
  Filled 2023-10-05: qty 150

## 2023-10-05 MED ORDER — PALONOSETRON HCL INJECTION 0.25 MG/5ML
0.2500 mg | Freq: Once | INTRAVENOUS | Status: AC
  Administered 2023-10-05: 0.25 mg via INTRAVENOUS
  Filled 2023-10-05: qty 5

## 2023-10-05 MED ORDER — SODIUM CHLORIDE 0.9 % IV SOLN: INTRAVENOUS | Status: DC

## 2023-10-05 MED ORDER — DOXORUBICIN HCL CHEMO IV INJECTION 2 MG/ML
60.0000 mg/m2 | Freq: Once | INTRAVENOUS | Status: AC
  Administered 2023-10-05: 116 mg via INTRAVENOUS
  Filled 2023-10-05: qty 58

## 2023-10-05 MED ORDER — METOPROLOL TARTRATE 25 MG PO TABS: 25.0000 mg | ORAL_TABLET | Freq: Two times a day (BID) | ORAL | 3 refills | Status: DC

## 2023-10-05 MED ORDER — SODIUM CHLORIDE 0.9 % IV SOLN
600.0000 mg/m2 | Freq: Once | INTRAVENOUS | Status: AC
  Administered 2023-10-05: 1160 mg via INTRAVENOUS
  Filled 2023-10-05: qty 58

## 2023-10-05 MED ORDER — SODIUM CHLORIDE 0.9 % IV SOLN
200.0000 mg | Freq: Once | INTRAVENOUS | Status: AC
  Administered 2023-10-05: 200 mg via INTRAVENOUS
  Filled 2023-10-05: qty 200

## 2023-10-05 MED ORDER — DEXAMETHASONE SODIUM PHOSPHATE 10 MG/ML IJ SOLN
10.0000 mg | Freq: Once | INTRAMUSCULAR | Status: AC
Start: 2023-10-05 — End: 2023-10-05
  Administered 2023-10-05: 10 mg via INTRAVENOUS
  Filled 2023-10-05: qty 1

## 2023-10-05 NOTE — Progress Notes (Signed)
 Patient Care Team: Patient, No Pcp Per as PCP - General (General Practice)  DIAGNOSIS:  Encounter Diagnosis  Name Primary?   Malignant neoplasm of lower-outer quadrant of right breast of female, estrogen receptor negative (HCC) Yes    SUMMARY OF ONCOLOGIC HISTORY: Oncology History  Malignant neoplasm of lower-outer quadrant of right breast of female, estrogen receptor negative (HCC)  02/22/2023 Initial Diagnosis   [facility]: Palpable mass, mammogram detected right breast mass 5 cm from the nipple 1.6 cm, separate oval circumscribed hypoechoic mass 7 mm (?  Intramammary lymph node: Benign) grouped amorphous calcifications UOQ left breast 7 mm (benign), right axillary lymph node biopsy negative Biopsy: Poorly differentiated IDC ER less than 1%, PR less than 1%, HER2 0, Ki-67 90%, skin satellite nodule biopsy positive   04/28/2023 -  Neo-Adjuvant Chemotherapy   Neoadjuvant chemotherapy with Taxol carboplatin (every 3 weeks x 4) Keytruda followed by Adriamycin Cytoxan Keytruda (Dr. Leonie Green at [facility]): AC-K cycle 1 given on 08/01/2022 at [facility] (echo 04/25/2023: EF 55 to 60%, strain -18.5%)   08/04/2023 Cancer Staging   Staging form: Breast, AJCC 8th Edition - Clinical: Stage IIIC (cT4b, cN0, cM0, G3, ER-, PR-, HER2-) - Signed by Serena Croissant, MD on 08/04/2023 Stage prefix: Initial diagnosis Histologic grading system: 3 grade system   08/04/2023 -  Chemotherapy   Patient is on Treatment Plan : BREAST Pembrolizumab (200) D1 + Carboplatin (5) D1 + Paclitaxel (80) D1,8,15 q21d X 4 cycles / Pembrolizumab (200) D1 + AC D1 q21d x 4 cycles       CHIEF COMPLIANT: Cycle 4 Adriamycin Cytoxan Keytruda  HISTORY OF PRESENT ILLNESS:   History of Present Illness The patient, who is concluding chemotherapy treatment for cancer, presents with fatigue, tachycardia, and dizziness. She attributes these symptoms to anxiety and expresses eagerness to return to work. She also reports a lack of  appetite and changes in taste due to mouth sores, which have limited her food intake to mainly bananas, fruit, and water. She mentions having an appetite for the first time in a while and eating a burger. The patient also expresses concern about her elevated heart rate and is interested in measures to manage it.     ALLERGIES:  has no known allergies.  MEDICATIONS:  Current Outpatient Medications  Medication Sig Dispense Refill   fluconazole (DIFLUCAN) 100 MG tablet Take 1 tablet (100 mg total) by mouth daily. 5 tablet 0   gabapentin (NEURONTIN) 100 MG capsule Take 1 capsule (100 mg total) by mouth 3 (three) times daily. 90 capsule 0   lidocaine (LIDODERM) 5 % Place 1 patch onto the skin daily. Remove & Discard patch within 12 hours or as directed by MD 30 patch 0   ondansetron (ZOFRAN) 8 MG tablet Take 1 tablet (8 mg total) by mouth every 8 (eight) hours as needed for nausea or vomiting. (Patient not taking: Reported on 10/05/2023) 30 tablet 1   oxyCODONE-acetaminophen (PERCOCET) 5-325 MG tablet Take 1-2 tablets by mouth every 6 (six) hours as needed. 20 tablet 0   predniSONE (DELTASONE) 10 MG tablet Take 2 tablets (20 mg total) by mouth 2 (two) times daily. 20 tablet 0   prochlorperazine (COMPAZINE) 10 MG tablet Take 1 tablet (10 mg total) by mouth every 6 (six) hours as needed for nausea or vomiting. (Patient not taking: Reported on 10/05/2023) 30 tablet 0   No current facility-administered medications for this visit.    PHYSICAL EXAMINATION: ECOG PERFORMANCE STATUS: 1 - Symptomatic but  completely ambulatory  Vitals:   10/05/23 1316  BP: 133/66  Pulse: (!) 115  Resp: 17  Temp: 97.6 F (36.4 C)  SpO2: 100%   Filed Weights   10/05/23 1316  Weight: 163 lb 9.6 oz (74.2 kg)      LABORATORY DATA:  I have reviewed the data as listed    Latest Ref Rng & Units 09/15/2023    7:58 AM 08/23/2023    8:50 AM  CMP  Glucose 70 - 99 mg/dL 213  086   BUN 6 - 20 mg/dL 7  7   Creatinine  5.78 - 1.00 mg/dL 4.69  6.29   Sodium 528 - 145 mmol/L 139  136   Potassium 3.5 - 5.1 mmol/L 3.6  3.6   Chloride 98 - 111 mmol/L 104  101   CO2 22 - 32 mmol/L 31  28   Calcium 8.9 - 10.3 mg/dL 8.9  9.2   Total Protein 6.5 - 8.1 g/dL 6.5  7.3   Total Bilirubin 0.0 - 1.2 mg/dL 0.3  0.3   Alkaline Phos 38 - 126 U/L 75  75   AST 15 - 41 U/L 12  18   ALT 0 - 44 U/L 19  17     Lab Results  Component Value Date   WBC 8.0 10/05/2023   HGB 10.1 (L) 10/05/2023   HCT 30.0 (L) 10/05/2023   MCV 84.0 10/05/2023   PLT 378 10/05/2023   NEUTROABS 5.3 10/05/2023    ASSESSMENT & PLAN:  Malignant neoplasm of lower-outer quadrant of right breast of female, estrogen receptor negative (HCC) 02/22/2023: [facility]: Palpable mass, mammogram detected right breast mass 5 cm from the nipple 1.6 cm, separate oval circumscribed hypoechoic mass 7 mm (?  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%, skin satellite nodule biopsy positive   Treatment plan: Neoadjuvant chemotherapy with Taxol Harrell Gave every 3 weeks x 4 (completed at [facility]), received first cycle of Adriamycin Cytoxan Keytruda on 08/01/2022: Plan to complete keynote 522 regimen with cycle 2 Adriamycin Cytoxan Keytruda to be given 08/23/2023, followed by Keytruda maintenance Followed by breast conserving surgery with targeted node dissection Adjuvant radiation therapy ----------------------------------------------------------------------------------------------------------------------------------------------------- Current Treatment: Cycle 4 AC-Keytruda Chemo toxicities: Alopecia Severe pain in the left hip: Much improved Pain in her hands also much improved Tachycardia   07/27/2023: Ultrasound and mammogram: Right breast mass increased from 1.6 cm to 3.3 cm,  decrease in the intramammary lymph node and right axillary lymph node   Tachycardia: I sent a prescription for metoprolol 25 mg p.o. twice daily We will hold off on giving any more Keytruda maintenance at this time.  If she has a complete pathologic response then we will discontinue Keytruda.  However if she has residual disease after surgery then I will continue Keytruda maintenance to complete 1 year of total therapy. Breast MRI has been scheduled for 10/09/2023. I sent a message to Dr. Carolynne Edouard to see her after the MRI.  Her short-term disability has run out and she unfortunately has to go back to work even though she is not fully recovered from treatment. I will see her back after surgery to discuss the final pathology report. Assessment & Plan Malignant neoplasm of lower-outer quadrant of right breast, estrogen receptor negative (HCC) Completing chemotherapy with Keytruda and AC. MRI scheduled to assess  response. Rande Lawman may be discontinued if complete response; otherwise, maintenance continues for one year. Surgery planned post-chemotherapy. - Schedule breast MRI for 10/09/2023. - Coordinate follow-up with Dr. Carolynne Edouard post-MRI for surgical discussion. - Continue Keytruda if residual disease post-surgery. - Plan surgery approximately five weeks after last chemotherapy, pending recovery and MRI results.  Tachycardia Tachycardia likely related to anxiety, anemia, or chemotherapy. Hemoglobin 10.1. Fatigue and dizziness present. Anxiety may contribute but not sole cause. - Prescribe metoprolol 25 mg PO BID, skip morning dose if heart rate <100 bpm. - Consider cardiology referral if no improvement in weeks. - Monitor blood pressure due to metoprolol.  Anemia Hemoglobin 10.1, consistent with chemotherapy. Fatigue and appetite loss due to mouth sores and taste issues. - Encourage increased protein intake, use cold protein drinks with straw.  Psychosocial stress Significant stress from medical  condition and financial pressures. Short-term disability ended, concerned about car loss. - Discuss options for extending short-term disability or financial assistance programs.      No orders of the defined types were placed in this encounter.  The patient has a good understanding of the overall plan. she agrees with it. she will call with any problems that may develop before the next visit here. Total time spent: 30 mins including face to face time and time spent for planning, charting and co-ordination of care   Tamsen Meek, MD 10/05/23

## 2023-10-06 ENCOUNTER — Encounter: Payer: Self-pay | Admitting: Licensed Clinical Social Worker

## 2023-10-06 ENCOUNTER — Telehealth: Payer: Self-pay | Admitting: *Deleted

## 2023-10-06 NOTE — Telephone Encounter (Signed)
 Called [facility] and changed order for STAT read so results would be available for Dr. Carolynne Edouard 3/18.

## 2023-10-06 NOTE — Progress Notes (Signed)
 [facility] CSW Progress Note  Visual merchandiser received bills that pt dropped off to include with her [employer] application.  Application and bills sent to [employer] on 10/06/2023.    Zaveon Gillen E Malaka Ruffner, LCSW Clinical Social Worker [facility]    Patient is participating in a Managed Medicaid Plan:  Yes

## 2023-10-07 ENCOUNTER — Inpatient Hospital Stay: Payer: Medicaid Other

## 2023-10-07 VITALS — BP 112/66 | HR 96 | Temp 97.2°F | Resp 17

## 2023-10-07 DIAGNOSIS — Z5111 Encounter for antineoplastic chemotherapy: Secondary | ICD-10-CM | POA: Diagnosis not present

## 2023-10-07 DIAGNOSIS — Z171 Estrogen receptor negative status [ER-]: Secondary | ICD-10-CM

## 2023-10-07 MED ORDER — PEGFILGRASTIM-CBQV 6 MG/0.6ML ~~LOC~~ SOSY
6.0000 mg | PREFILLED_SYRINGE | Freq: Once | SUBCUTANEOUS | Status: AC
  Administered 2023-10-07: 6 mg via SUBCUTANEOUS
  Filled 2023-10-07: qty 0.6

## 2023-10-09 ENCOUNTER — Ambulatory Visit (HOSPITAL_COMMUNITY): Admission: RE | Admit: 2023-10-09 | Payer: Medicaid Other | Source: Ambulatory Visit

## 2023-10-09 ENCOUNTER — Telehealth: Payer: Self-pay

## 2023-10-09 NOTE — Telephone Encounter (Signed)
 Pt called and LVM stating she is experiencing hip pain and "needs pain medicine". Called pt to further asses and pt was very incoherent with her speech with slurring of words. She denies taking anything for pain. She endorses pain to her hips. Advised pt this could very likely be from her Greggory Keen she received Saturday 3/15. Educated pt on G-CSF shot and how it works. Advised pt to take Tylenol and Claritin for 5 days. S/w pt's mother, Talbert Forest to relay this message.

## 2023-10-09 NOTE — Telephone Encounter (Signed)
 Registrar sent the following message while this nurse out of office.   Good morning. I noticed you wanted Ms. Erika Lopez to fill out a ROI but she stated she no longer wished to sign it as her job wouldn't give her an extension.

## 2023-10-10 ENCOUNTER — Other Ambulatory Visit: Payer: Self-pay | Admitting: Hematology and Oncology

## 2023-10-10 ENCOUNTER — Telehealth: Payer: Self-pay | Admitting: *Deleted

## 2023-10-10 ENCOUNTER — Ambulatory Visit (HOSPITAL_COMMUNITY)
Admission: RE | Admit: 2023-10-10 | Discharge: 2023-10-10 | Disposition: A | Discharge status: Home / Self Care | Source: Ambulatory Visit | Attending: Hematology and Oncology | Admitting: Hematology and Oncology

## 2023-10-10 ENCOUNTER — Other Ambulatory Visit: Payer: Self-pay | Admitting: *Deleted

## 2023-10-10 DIAGNOSIS — R928 Other abnormal and inconclusive findings on diagnostic imaging of breast: Secondary | ICD-10-CM

## 2023-10-10 DIAGNOSIS — Z171 Estrogen receptor negative status [ER-]: Secondary | ICD-10-CM | POA: Insufficient documentation

## 2023-10-10 DIAGNOSIS — C50511 Malignant neoplasm of lower-outer quadrant of right female breast: Secondary | ICD-10-CM

## 2023-10-10 DIAGNOSIS — R92323 Mammographic fibroglandular density, bilateral breasts: Secondary | ICD-10-CM | POA: Diagnosis not present

## 2023-10-10 MED ORDER — GADOBUTROL 1 MMOL/ML IV SOLN
7.5000 mL | Freq: Once | INTRAVENOUS | Status: AC | PRN
  Administered 2023-10-10: 7.5 mL via INTRAVENOUS

## 2023-10-10 NOTE — Telephone Encounter (Signed)
 Spoke with patient regarding her MRI results and the need for bx on left breast.  It was very difficult to communicate with her as she was mumbling and talking very softly.  I explained the need for the bx and I confirmed that she also missed her appt today with Dr. Carolynne Edouard. Informed her she would get a call from [facility] to schedule her bx and I would inform Dr. Billey Chang office that her appt needs to be r/s and she would receive a call from his office with a new appt. I repeated this several times. At the end of the conversation she stated that she called our office regarding some pain medication but was confused.  It looks like she was given instructions yesterday regarding this and I reviewed this with her.  She verbalized understanding.

## 2023-10-11 ENCOUNTER — Other Ambulatory Visit: Payer: Self-pay | Admitting: Hematology and Oncology

## 2023-10-11 DIAGNOSIS — R928 Other abnormal and inconclusive findings on diagnostic imaging of breast: Secondary | ICD-10-CM

## 2023-10-11 DIAGNOSIS — C50511 Malignant neoplasm of lower-outer quadrant of right female breast: Secondary | ICD-10-CM

## 2023-10-12 ENCOUNTER — Encounter: Payer: Self-pay | Admitting: *Deleted

## 2023-10-13 ENCOUNTER — Ambulatory Visit
Admission: RE | Admit: 2023-10-13 | Discharge: 2023-10-13 | Disposition: A | Discharge status: Home / Self Care | Source: Ambulatory Visit | Attending: Hematology and Oncology | Admitting: Hematology and Oncology

## 2023-10-13 ENCOUNTER — Other Ambulatory Visit: Payer: Self-pay | Admitting: Hematology and Oncology

## 2023-10-13 ENCOUNTER — Other Ambulatory Visit: Payer: Self-pay

## 2023-10-13 ENCOUNTER — Ambulatory Visit

## 2023-10-13 DIAGNOSIS — R928 Other abnormal and inconclusive findings on diagnostic imaging of breast: Secondary | ICD-10-CM

## 2023-10-13 DIAGNOSIS — Z171 Estrogen receptor negative status [ER-]: Secondary | ICD-10-CM

## 2023-10-13 MED ORDER — GADOPICLENOL 0.5 MMOL/ML IV SOLN
7.0000 mL | Freq: Once | INTRAVENOUS | Status: AC | PRN
  Administered 2023-10-13: 7 mL via INTRAVENOUS

## 2023-10-16 ENCOUNTER — Encounter: Payer: Self-pay | Admitting: *Deleted

## 2023-10-16 ENCOUNTER — Telehealth: Payer: Self-pay | Admitting: Licensed Clinical Social Worker

## 2023-10-16 NOTE — Telephone Encounter (Signed)
 TC from pt asking about help with car payment. CSW recommended calling Everlean Alstrom to determine if she has money left on the [employer] to potentially assist with this cost. Pt has already utilized [employer], [employer], and has application pending with the [employer]. Unfortunately no other funds are available for car payment that this CSW is aware of.   Chanika Byland E Jessamyn Watterson, LCSW

## 2023-10-20 ENCOUNTER — Ambulatory Visit: Payer: Self-pay | Admitting: General Surgery

## 2023-10-20 DIAGNOSIS — Z171 Estrogen receptor negative status [ER-]: Secondary | ICD-10-CM | POA: Diagnosis not present

## 2023-10-20 DIAGNOSIS — C50511 Malignant neoplasm of lower-outer quadrant of right female breast: Secondary | ICD-10-CM | POA: Diagnosis not present

## 2023-10-20 MED ORDER — KETOROLAC TROMETHAMINE 15 MG/ML IJ SOLN: 15.0000 mg | INTRAMUSCULAR | Status: AC

## 2023-10-24 ENCOUNTER — Encounter: Payer: Self-pay | Admitting: *Deleted

## 2023-10-24 DIAGNOSIS — C50511 Malignant neoplasm of lower-outer quadrant of right female breast: Secondary | ICD-10-CM

## 2023-10-25 ENCOUNTER — Other Ambulatory Visit: Payer: Self-pay

## 2023-10-26 ENCOUNTER — Other Ambulatory Visit: Payer: Self-pay

## 2023-11-01 ENCOUNTER — Telehealth: Payer: Self-pay | Admitting: Hematology and Oncology

## 2023-11-01 NOTE — Telephone Encounter (Signed)
 Confirmed with pt about scheduled appt date and time

## 2023-11-02 ENCOUNTER — Other Ambulatory Visit: Payer: Self-pay

## 2023-11-02 NOTE — Progress Notes (Signed)
 Surgical Instructions   Your procedure is scheduled on Tuesday, April 15th, 2025 . Report to [facility] Main Entrance "A" at 5:30 A.M., then check in with the Admitting office. Any questions or running late day of surgery: call [phone]  Questions prior to your surgery date: call [phone], Monday-Friday, 8am-4pm. If you experience any cold or flu symptoms such as cough, fever, chills, shortness of breath, etc. between now and your scheduled surgery, please notify us at the above number.     Remember:  Do not eat after midnight the night before your surgery   You may drink clear liquids until 4:30 the morning of your surgery.   Clear liquids allowed are: Water, Non-Citrus Juices (without pulp), Carbonated Beverages, Clear Tea (no milk, honey, etc.), Black Coffee Only (NO MILK, CREAM OR POWDERED CREAMER of any kind), and Gatorade.    Take these medicines the morning of surgery with A SIP OF WATER: Fluconazole (Diflucan) Gabapentin (Neurontin) Metoprolol Tartrate (Lopressor) Prednisone (Deltasone)   May take these medicines IF NEEDED: None.    One week prior to surgery, STOP taking any Aspirin (unless otherwise instructed by your surgeon) Aleve, Naproxen, Ibuprofen, Motrin, Advil, Goody's, BC's, all herbal medications, fish oil, and non-prescription vitamins.                     Do NOT Smoke (Tobacco/Vaping) for 24 hours prior to your procedure.  If you use a CPAP at night, you may bring your mask/headgear for your overnight stay.   You will be asked to remove any contacts, glasses, piercing's, hearing aid's, dentures/partials prior to surgery. Please bring cases for these items if needed.    Patients discharged the day of surgery will not be allowed to drive home, and someone needs to stay with them for 24 hours.  SURGICAL WAITING ROOM VISITATION Patients may have no more than 2 support people in the waiting area - these visitors may rotate.   Pre-op nurse will  coordinate an appropriate time for 1 ADULT support person, who may not rotate, to accompany patient in pre-op.  Children under the age of 76 must have an adult with them who is not the patient and must remain in the main waiting area with an adult.  If the patient needs to stay at the hospital during part of their recovery, the visitor guidelines for inpatient rooms apply.  Please refer to the [facility] website for the visitor guidelines for any additional information.   If you received a COVID test during your pre-op visit  it is requested that you wear a mask when out in public, stay away from anyone that may not be feeling well and notify your surgeon if you develop symptoms. If you have been in contact with anyone that has tested positive in the last 10 days please notify you surgeon.      Pre-operative CHG Bathing Instructions   You can play a key role in reducing the risk of infection after surgery. Your skin needs to be as free of germs as possible. You can reduce the number of germs on your skin by washing with CHG (chlorhexidine gluconate) soap before surgery. CHG is an antiseptic soap that kills germs and continues to kill germs even after washing.   DO NOT use if you have an allergy to chlorhexidine/CHG or antibacterial soaps. If your skin becomes reddened or irritated, stop using the CHG and notify one of our RNs at [phone].  TAKE A SHOWER THE NIGHT BEFORE SURGERY AND THE DAY OF SURGERY    Please keep in mind the following:  DO NOT shave, including legs and underarms, 48 hours prior to surgery.   You may shave your face before/day of surgery.  Place clean sheets on your bed the night before surgery Use a clean washcloth (not used since being washed) for each shower. DO NOT sleep with pet's night before surgery.  CHG Shower Instructions:  Wash your face and private area with normal soap. If you choose to wash your hair, wash first with your normal shampoo.   After you use shampoo/soap, rinse your hair and body thoroughly to remove shampoo/soap residue.  Turn the water OFF and apply half the bottle of CHG soap to a CLEAN washcloth.  Apply CHG soap ONLY FROM YOUR NECK DOWN TO YOUR TOES (washing for 3-5 minutes)  DO NOT use CHG soap on face, private areas, open wounds, or sores.  Pay special attention to the area where your surgery is being performed.  If you are having back surgery, having someone wash your back for you may be helpful. Wait 2 minutes after CHG soap is applied, then you may rinse off the CHG soap.  Pat dry with a clean towel  Put on clean pajamas    Additional instructions for the day of surgery: DO NOT APPLY any lotions, deodorants, cologne, or perfumes.   Do not wear jewelry or makeup Do not wear nail polish, gel polish, artificial nails, or any other type of covering on natural nails (fingers and toes) Do not bring valuables to the hospital. [facility] is not responsible for valuables/personal belongings. Put on clean/comfortable clothes.  Please brush your teeth.  Ask your nurse before applying any prescription medications to the skin.

## 2023-11-03 ENCOUNTER — Encounter (HOSPITAL_COMMUNITY)
Admission: RE | Admit: 2023-11-03 | Discharge: 2023-11-03 | Disposition: A | Discharge status: Home / Self Care | Source: Ambulatory Visit | Attending: General Surgery | Admitting: General Surgery

## 2023-11-03 ENCOUNTER — Encounter (HOSPITAL_COMMUNITY): Payer: Self-pay | Admitting: General Surgery

## 2023-11-03 ENCOUNTER — Other Ambulatory Visit: Payer: Self-pay

## 2023-11-03 VITALS — BP 108/76 | HR 86 | Temp 98.1°F | Resp 18 | Ht 70.0 in | Wt 171.1 lb

## 2023-11-03 DIAGNOSIS — Z01812 Encounter for preprocedural laboratory examination: Secondary | ICD-10-CM | POA: Insufficient documentation

## 2023-11-03 DIAGNOSIS — Z01818 Encounter for other preprocedural examination: Secondary | ICD-10-CM | POA: Diagnosis present

## 2023-11-03 LAB — BASIC METABOLIC PANEL WITH GFR
Anion gap: 9 (ref 5–15)
BUN: 7 mg/dL (ref 6–20)
CO2: 26 mmol/L (ref 22–32)
Calcium: 9.3 mg/dL (ref 8.9–10.3)
Chloride: 105 mmol/L (ref 98–111)
Creatinine, Ser: 0.55 mg/dL (ref 0.44–1.00)
GFR, Estimated: >60 mL/min (ref >60)
Glucose, Bld: 76 mg/dL (ref 70–99)
Potassium: 4.1 mmol/L (ref 3.5–5.1)
Sodium: 140 mmol/L (ref 135–145)

## 2023-11-03 LAB — CBC
HCT: 33.2 % — ABNORMAL LOW (ref 36.0–46.0)
Hemoglobin: 10.7 g/dL — ABNORMAL LOW (ref 12.0–15.0)
MCH: 28.5 pg (ref 26.0–34.0)
MCHC: 32.2 g/dL (ref 30.0–36.0)
MCV: 88.5 fL (ref 80.0–100.0)
Platelets: 324 10*3/uL (ref 150–400)
RBC: 3.75 MIL/uL — ABNORMAL LOW (ref 3.87–5.11)
RDW: 18.6 % — ABNORMAL HIGH (ref 11.5–15.5)
WBC: 5.7 10*3/uL (ref 4.0–10.5)
nRBC: 0 % (ref 0.0–0.2)

## 2023-11-03 NOTE — Progress Notes (Signed)
 Surgical Instructions     Your procedure is scheduled on Tuesday, April 15th, 2025 . Report to [facility] Main Entrance "A" at 5:30 A.M., then check in with the Admitting office. Any questions or running late day of surgery: call [phone]   Questions prior to your surgery date: call [phone], Monday-Friday, 8am-4pm. If you experience any cold or flu symptoms such as cough, fever, chills, shortness of breath, etc. between now and your scheduled surgery, please notify us at the above number.            Remember:       Do not eat after midnight the night before your surgery     You may drink clear liquids until 4:30 the morning of your surgery.   Clear liquids allowed are: Water, Non-Citrus Juices (without pulp), Carbonated Beverages, Clear Tea (no milk, honey, etc.), Black Coffee Only (NO MILK, CREAM OR POWDERED CREAMER of any kind), and Gatorade.          Take these medicines the morning of surgery with A SIP OF WATER: Gabapentin (Neurontin)   May take these medicines IF NEEDED: Metoprolol Tartrate (Lopressor)   One week prior to surgery, STOP taking any Aspirin (unless otherwise instructed by your surgeon) Aleve, Naproxen, Ibuprofen, Motrin, Advil, Goody's, BC's, all herbal medications, fish oil, and non-prescription vitamins.                     Do NOT Smoke (Tobacco/Vaping) for 24 hours prior to your procedure.   If you use a CPAP at night, you may bring your mask/headgear for your overnight stay.   You will be asked to remove any contacts, glasses, piercing's, hearing aid's, dentures/partials prior to surgery. Please bring cases for these items if needed.    Patients discharged the day of surgery will not be allowed to drive home, and someone needs to stay with them for 24 hours.   SURGICAL WAITING ROOM VISITATION Patients may have no more than 2 support people in the waiting area - these visitors may rotate.   Pre-op nurse will coordinate an appropriate time for 1  ADULT support person, who may not rotate, to accompany patient in pre-op.  Children under the age of 44 must have an adult with them who is not the patient and must remain in the main waiting area with an adult.   If the patient needs to stay at the hospital during part of their recovery, the visitor guidelines for inpatient rooms apply.   Please refer to the [facility] website for the visitor guidelines for any additional information.     If you received a COVID test during your pre-op visit  it is requested that you wear a mask when out in public, stay away from anyone that may not be feeling well and notify your surgeon if you develop symptoms. If you have been in contact with anyone that has tested positive in the last 10 days please notify you surgeon.         Pre-operative CHG Bathing Instructions    You can play a key role in reducing the risk of infection after surgery. Your skin needs to be as free of germs as possible. You can reduce the number of germs on your skin by washing with CHG (chlorhexidine gluconate) soap before surgery. CHG is an antiseptic soap that kills germs and continues to kill germs even after washing.    DO NOT use if you have an allergy to chlorhexidine/CHG  or antibacterial soaps. If your skin becomes reddened or irritated, stop using the CHG and notify one of our RNs at [phone].               TAKE A SHOWER THE NIGHT BEFORE SURGERY AND THE DAY OF SURGERY     Please keep in mind the following:  DO NOT shave, including legs and underarms, 48 hours prior to surgery.   You may shave your face before/day of surgery.  Place clean sheets on your bed the night before surgery Use a clean washcloth (not used since being washed) for each shower. DO NOT sleep with pet's night before surgery.   CHG Shower Instructions:  Wash your face and private area with normal soap. If you choose to wash your hair, wash first with your normal shampoo.  After you use  shampoo/soap, rinse your hair and body thoroughly to remove shampoo/soap residue.  Turn the water OFF and apply half the bottle of CHG soap to a CLEAN washcloth.  Apply CHG soap ONLY FROM YOUR NECK DOWN TO YOUR TOES (washing for 3-5 minutes)  DO NOT use CHG soap on face, private areas, open wounds, or sores.  Pay special attention to the area where your surgery is being performed.  If you are having back surgery, having someone wash your back for you may be helpful. Wait 2 minutes after CHG soap is applied, then you may rinse off the CHG soap.  Pat dry with a clean towel  Put on clean pajamas     Additional instructions for the day of surgery: DO NOT APPLY any lotions, deodorants, cologne, or perfumes.   Do not wear jewelry or makeup Do not wear nail polish, gel polish, artificial nails, or any other type of covering on natural nails (fingers and toes) Do not bring valuables to the hospital. [facility] is not responsible for valuables/personal belongings. Put on clean/comfortable clothes.  Please brush your teeth.  Ask your nurse before applying any prescription medications to the skin.

## 2023-11-03 NOTE — Progress Notes (Signed)
 PCP - denies Cardiologist - denies  PPM/ICD - denies Device Orders - n/a Rep Notified - n/a  Chest x-ray - 05/31/2023 EKG - 05/31/2023 Stress Test - denies ECHO - denies Cardiac Cath - denies  Sleep Study - denies CPAP - n/a  Fasting Blood Sugar - no DM Checks Blood Sugar _____ times a day  Last dose of GLP1 agonist-  n/a GLP1 instructions: n/a  Blood Thinner Instructions: n/a Aspirin Instructions: n/a  ERAS Protcol -yes, till 0430 AM PRE-SURGERY Ensure or G2- no  COVID TEST- n/a   Anesthesia review: no  Patient denies shortness of breath, fever, cough and chest pain at PAT appointment   All instructions explained to the patient, with a verbal understanding of the material. Patient agrees to go over the instructions while at home for a better understanding. Patient also instructed to self quarantine after being tested for COVID-19. The opportunity to ask questions was provided.

## 2023-11-07 ENCOUNTER — Ambulatory Visit (HOSPITAL_COMMUNITY): Payer: Self-pay | Admitting: Vascular Surgery

## 2023-11-07 ENCOUNTER — Ambulatory Visit (HOSPITAL_BASED_OUTPATIENT_CLINIC_OR_DEPARTMENT_OTHER)

## 2023-11-07 ENCOUNTER — Other Ambulatory Visit: Payer: Self-pay

## 2023-11-07 ENCOUNTER — Encounter (HOSPITAL_COMMUNITY): Payer: Self-pay | Admitting: General Surgery

## 2023-11-07 ENCOUNTER — Ambulatory Visit (HOSPITAL_COMMUNITY)
Admission: RE | Admit: 2023-11-07 | Discharge: 2023-11-07 | Disposition: A | Discharge status: Home / Self Care | Attending: General Surgery | Admitting: General Surgery

## 2023-11-07 ENCOUNTER — Ambulatory Visit (HOSPITAL_COMMUNITY)
Admission: RE | Admit: 2023-11-07 | Discharge: 2023-11-07 | Disposition: A | Discharge status: Home / Self Care | Source: Ambulatory Visit | Attending: General Surgery | Admitting: General Surgery

## 2023-11-07 ENCOUNTER — Encounter (HOSPITAL_COMMUNITY)
Admission: RE | Disposition: A | Discharge status: Home / Self Care | Payer: Self-pay | Source: Home / Self Care | Attending: General Surgery

## 2023-11-07 DIAGNOSIS — I499 Cardiac arrhythmia, unspecified: Secondary | ICD-10-CM | POA: Diagnosis not present

## 2023-11-07 DIAGNOSIS — G8918 Other acute postprocedural pain: Secondary | ICD-10-CM | POA: Diagnosis not present

## 2023-11-07 DIAGNOSIS — C50511 Malignant neoplasm of lower-outer quadrant of right female breast: Secondary | ICD-10-CM | POA: Diagnosis not present

## 2023-11-07 DIAGNOSIS — Z87891 Personal history of nicotine dependence: Secondary | ICD-10-CM | POA: Insufficient documentation

## 2023-11-07 DIAGNOSIS — Z9221 Personal history of antineoplastic chemotherapy: Secondary | ICD-10-CM | POA: Diagnosis not present

## 2023-11-07 DIAGNOSIS — C50911 Malignant neoplasm of unspecified site of right female breast: Secondary | ICD-10-CM | POA: Diagnosis not present

## 2023-11-07 DIAGNOSIS — Z01818 Encounter for other preprocedural examination: Secondary | ICD-10-CM

## 2023-11-07 DIAGNOSIS — Z5111 Encounter for antineoplastic chemotherapy: Secondary | ICD-10-CM | POA: Diagnosis not present

## 2023-11-07 DIAGNOSIS — Z171 Estrogen receptor negative status [ER-]: Secondary | ICD-10-CM | POA: Diagnosis not present

## 2023-11-07 HISTORY — PX: BREAST LUMPECTOMY: SHX2

## 2023-11-07 HISTORY — DX: Unspecified osteoarthritis, unspecified site: M19.90

## 2023-11-07 HISTORY — DX: Malignant (primary) neoplasm, unspecified: C80.1

## 2023-11-07 HISTORY — PX: SENTINEL NODE BIOPSY: SHX6608

## 2023-11-07 SURGERY — BREAST LUMPECTOMY
Anesthesia: General | Site: Breast | Laterality: Right

## 2023-11-07 MED ORDER — 0.9 % SODIUM CHLORIDE (POUR BTL) OPTIME
TOPICAL | Status: DC | PRN
  Administered 2023-11-07: 1000 mL

## 2023-11-07 MED ORDER — MIDAZOLAM HCL 2 MG/2ML IJ SOLN
INTRAMUSCULAR | Status: DC | PRN
  Administered 2023-11-07: 2 mg via INTRAVENOUS

## 2023-11-07 MED ORDER — CHLORHEXIDINE GLUCONATE CLOTH 2 % EX PADS: 6.0000 | MEDICATED_PAD | Freq: Once | CUTANEOUS | Status: DC

## 2023-11-07 MED ORDER — OXYCODONE HCL 5 MG PO TABS
ORAL_TABLET | ORAL | Status: AC
  Filled 2023-11-07: qty 1

## 2023-11-07 MED ORDER — ONDANSETRON HCL 4 MG/2ML IJ SOLN
INTRAMUSCULAR | Status: AC
  Filled 2023-11-07: qty 2

## 2023-11-07 MED ORDER — LACTATED RINGERS IV SOLN: INTRAVENOUS | Status: DC

## 2023-11-07 MED ORDER — OXYCODONE HCL 5 MG PO TABS
5.0000 mg | ORAL_TABLET | Freq: Once | ORAL | Status: AC | PRN
  Administered 2023-11-07: 5 mg via ORAL

## 2023-11-07 MED ORDER — BUPIVACAINE-EPINEPHRINE (PF) 0.25% -1:200000 IJ SOLN
INTRAMUSCULAR | Status: AC
  Filled 2023-11-07: qty 30

## 2023-11-07 MED ORDER — GABAPENTIN 100 MG PO CAPS
100.0000 mg | ORAL_CAPSULE | ORAL | Status: AC
  Administered 2023-11-07: 100 mg via ORAL
  Filled 2023-11-07: qty 1

## 2023-11-07 MED ORDER — ONDANSETRON HCL 4 MG/2ML IJ SOLN
INTRAMUSCULAR | Status: DC | PRN
  Administered 2023-11-07: 4 mg via INTRAVENOUS

## 2023-11-07 MED ORDER — CHLORHEXIDINE GLUCONATE 0.12 % MT SOLN
15.0000 mL | Freq: Once | OROMUCOSAL | Status: AC
  Administered 2023-11-07: 15 mL via OROMUCOSAL
  Filled 2023-11-07: qty 15

## 2023-11-07 MED ORDER — LIDOCAINE 2% (20 MG/ML) 5 ML SYRINGE
INTRAMUSCULAR | Status: AC
  Filled 2023-11-07: qty 5

## 2023-11-07 MED ORDER — TECHNETIUM TC 99M TILMANOCEPT KIT
1.0000 | PACK | Freq: Once | INTRAVENOUS | Status: AC | PRN
  Administered 2023-11-07: 1 via INTRADERMAL

## 2023-11-07 MED ORDER — DEXAMETHASONE SODIUM PHOSPHATE 10 MG/ML IJ SOLN
INTRAMUSCULAR | Status: DC | PRN
  Administered 2023-11-07: 5 mg via INTRAVENOUS

## 2023-11-07 MED ORDER — ORAL CARE MOUTH RINSE: 15.0000 mL | Freq: Once | OROMUCOSAL | Status: AC

## 2023-11-07 MED ORDER — HYDROMORPHONE HCL 1 MG/ML IJ SOLN: 0.2500 mg | INTRAMUSCULAR | Status: DC | PRN

## 2023-11-07 MED ORDER — LIDOCAINE 2% (20 MG/ML) 5 ML SYRINGE
INTRAMUSCULAR | Status: DC | PRN
Start: 2023-11-07 — End: 2023-11-07
  Administered 2023-11-07: 40 mg via INTRAVENOUS

## 2023-11-07 MED ORDER — SODIUM CHLORIDE 0.9 % IV SOLN: 12.5000 mg | INTRAVENOUS | Status: DC | PRN

## 2023-11-07 MED ORDER — FENTANYL CITRATE (PF) 250 MCG/5ML IJ SOLN
INTRAMUSCULAR | Status: AC
Start: 2023-11-07 — End: ?
  Filled 2023-11-07: qty 5

## 2023-11-07 MED ORDER — CEFAZOLIN SODIUM-DEXTROSE 2-4 GM/100ML-% IV SOLN
2.0000 g | INTRAVENOUS | Status: AC
  Administered 2023-11-07: 2 g via INTRAVENOUS
  Filled 2023-11-07: qty 100

## 2023-11-07 MED ORDER — DEXAMETHASONE SODIUM PHOSPHATE 10 MG/ML IJ SOLN
INTRAMUSCULAR | Status: AC
  Filled 2023-11-07: qty 1

## 2023-11-07 MED ORDER — AMISULPRIDE (ANTIEMETIC) 5 MG/2ML IV SOLN: 10.0000 mg | Freq: Once | INTRAVENOUS | Status: DC | PRN

## 2023-11-07 MED ORDER — PROPOFOL 10 MG/ML IV BOLUS
INTRAVENOUS | Status: AC
  Filled 2023-11-07: qty 20

## 2023-11-07 MED ORDER — OXYCODONE HCL 5 MG/5ML PO SOLN: 5.0000 mg | Freq: Once | ORAL | Status: AC | PRN

## 2023-11-07 MED ORDER — OXYCODONE HCL 5 MG PO TABS: 5.0000 mg | ORAL_TABLET | Freq: Four times a day (QID) | ORAL | 0 refills | Status: DC | PRN

## 2023-11-07 MED ORDER — MIDAZOLAM HCL 2 MG/2ML IJ SOLN
INTRAMUSCULAR | Status: AC
  Filled 2023-11-07: qty 2

## 2023-11-07 MED ORDER — PROPOFOL 10 MG/ML IV BOLUS
INTRAVENOUS | Status: DC | PRN
  Administered 2023-11-07: 200 mg via INTRAVENOUS

## 2023-11-07 MED ORDER — FENTANYL CITRATE (PF) 250 MCG/5ML IJ SOLN
INTRAMUSCULAR | Status: DC | PRN
  Administered 2023-11-07 (×3): 25 ug via INTRAVENOUS
  Administered 2023-11-07: 50 ug via INTRAVENOUS
  Administered 2023-11-07: 25 ug via INTRAVENOUS

## 2023-11-07 MED ORDER — ROPIVACAINE HCL 5 MG/ML IJ SOLN
INTRAMUSCULAR | Status: DC | PRN
  Administered 2023-11-07: 30 mL via PERINEURAL

## 2023-11-07 MED ORDER — BUPIVACAINE-EPINEPHRINE 0.25% -1:200000 IJ SOLN
INTRAMUSCULAR | Status: DC | PRN
  Administered 2023-11-07: 24.5 mL

## 2023-11-07 MED ORDER — MEPERIDINE HCL 25 MG/ML IJ SOLN: 6.2500 mg | INTRAMUSCULAR | Status: DC | PRN

## 2023-11-07 MED ORDER — ACETAMINOPHEN 500 MG PO TABS
1000.0000 mg | ORAL_TABLET | ORAL | Status: AC
  Administered 2023-11-07: 1000 mg via ORAL
  Filled 2023-11-07: qty 2

## 2023-11-07 SURGICAL SUPPLY — 27 items
APPLIER CLIP 9.375 MED OPEN (MISCELLANEOUS) ×4 IMPLANT
BAG COUNTER SPONGE SURGICOUNT (BAG) IMPLANT
BINDER BREAST XLRG (GAUZE/BANDAGES/DRESSINGS) IMPLANT
CANISTER SUCT 3000ML PPV (MISCELLANEOUS) ×2 IMPLANT
CHLORAPREP W/TINT 26 (MISCELLANEOUS) ×2 IMPLANT
CLIP APPLIE 9.375 MED OPEN (MISCELLANEOUS) IMPLANT
COVER PROBE W GEL 5X96 (DRAPES) ×2 IMPLANT
COVER SURGICAL LIGHT HANDLE (MISCELLANEOUS) ×2 IMPLANT
DERMABOND ADVANCED .7 DNX12 (GAUZE/BANDAGES/DRESSINGS) ×2 IMPLANT
DEVICE DUBIN SPECIMEN MAMMOGRA (MISCELLANEOUS) ×2 IMPLANT
DRAPE CHEST BREAST 15X10 FENES (DRAPES) ×2 IMPLANT
ELECT COATED BLADE 2.86 ST (ELECTRODE) ×2 IMPLANT
ELECT REM PT RETURN 9FT ADLT (ELECTROSURGICAL) ×2 IMPLANT
ELECTRODE REM PT RTRN 9FT ADLT (ELECTROSURGICAL) ×2 IMPLANT
GLOVE BIO SURGEON STRL SZ7.5 (GLOVE) ×4 IMPLANT
GOWN STRL REUS W/ TWL LRG LVL3 (GOWN DISPOSABLE) ×4 IMPLANT
KIT BASIN OR (CUSTOM PROCEDURE TRAY) ×2 IMPLANT
KIT MARKER MARGIN INK (KITS) ×2 IMPLANT
NDL HYPO 25GX1X1/2 BEV (NEEDLE) ×2 IMPLANT
NEEDLE HYPO 25GX1X1/2 BEV (NEEDLE) ×2 IMPLANT
NS IRRIG 1000ML POUR BTL (IV SOLUTION) ×2 IMPLANT
PACK GENERAL/GYN (CUSTOM PROCEDURE TRAY) ×2 IMPLANT
SUT MNCRL AB 4-0 PS2 18 (SUTURE) ×2 IMPLANT
SUT VIC AB 3-0 SH 18 (SUTURE) ×2 IMPLANT
SYR CONTROL 10ML LL (SYRINGE) ×2 IMPLANT
TOWEL GREEN STERILE (TOWEL DISPOSABLE) ×2 IMPLANT
TOWEL GREEN STERILE FF (TOWEL DISPOSABLE) ×2 IMPLANT

## 2023-11-07 NOTE — Progress Notes (Signed)
 Dr. Annabell Key made aware that patient takes Metoprolol prn for fast heart rate. Ne need to give Metoprolol today per Dr. Annabell Key. Heart rate= 75.

## 2023-11-07 NOTE — Transfer of Care (Signed)
 Immediate Anesthesia Transfer of Care Note  Patient: Erika Lopez  Procedure(s) Performed: BREAST LUMPECTOMY (Right: Breast) BIOPSY, LYMPH NODE, SENTINEL (Right: Axilla)  Patient Location: PACU  Anesthesia Type:GA combined with regional for post-op pain  Level of Consciousness: drowsy and responds to stimulation  Airway & Oxygen Therapy: Patient Spontanous Breathing and Patient connected to face mask oxygen  Post-op Assessment: Report given to RN and Post -op Vital signs reviewed and stable  Post vital signs: Reviewed and stable  Last Vitals:  Vitals Value Taken Time  BP 143/77 11/07/23 0908  Temp    Pulse 88 11/07/23 0910  Resp 12 11/07/23 0910  SpO2 100 % 11/07/23 0910  Vitals shown include unfiled device data.  Last Pain:  Vitals:   11/07/23 0602  TempSrc:   PainSc: 0-No pain      Patients Stated Pain Goal: 0 (11/07/23 0554)  Complications: No notable events documented.

## 2023-11-07 NOTE — Interval H&P Note (Signed)
 History and Physical Interval Note:  11/07/2023 7:10 AM  Erika Lopez  has presented today for surgery, with the diagnosis of RIGHT BREAST CANCER.  The various methods of treatment have been discussed with the patient and family. After consideration of risks, benefits and other options for treatment, the patient has consented to  Procedure(s) with comments: BREAST LUMPECTOMY (Right) - RIGHT BREAST LUMPECTOMY AND SENTINEL NODE BIOPSY BIOPSY, LYMPH NODE, SENTINEL (Right) as a surgical intervention.  The patient's history has been reviewed, patient examined, no change in status, stable for surgery.  I have reviewed the patient's chart and labs.  Questions were answered to the patient's satisfaction.     Lillette Reid III

## 2023-11-07 NOTE — Anesthesia Procedure Notes (Signed)
 Procedure Name: LMA Insertion Date/Time: 11/07/2023 7:52 AM  Performed by: Zola Hint, CRNAPre-anesthesia Checklist: Patient identified, Emergency Drugs available, Suction available and Patient being monitored Patient Re-evaluated:Patient Re-evaluated prior to induction Oxygen Delivery Method: Circle System Utilized Preoxygenation: Pre-oxygenation with 100% oxygen Induction Type: IV induction Ventilation: Mask ventilation without difficulty LMA: LMA inserted LMA Size: 4.0 Number of attempts: 1 Airway Equipment and Method: Bite block Placement Confirmation: positive ETCO2 Tube secured with: Tape Dental Injury: Teeth and Oropharynx as per pre-operative assessment

## 2023-11-07 NOTE — Anesthesia Postprocedure Evaluation (Signed)
 Anesthesia Post Note  Patient: Para Wiltsie  Procedure(s) Performed: BREAST LUMPECTOMY (Right: Breast) BIOPSY, LYMPH NODE, SENTINEL (Right: Axilla)     Patient location during evaluation: PACU Anesthesia Type: General Level of consciousness: awake and alert Pain management: pain level controlled Vital Signs Assessment: post-procedure vital signs reviewed and stable Respiratory status: spontaneous breathing, nonlabored ventilation and respiratory function stable Cardiovascular status: blood pressure returned to baseline and stable Postop Assessment: no apparent nausea or vomiting Anesthetic complications: no   No notable events documented.  Last Vitals:  Vitals:   11/07/23 0930 11/07/23 0943  BP: 137/86 134/83  Pulse: 82 72  Resp: 14 17  Temp:  36.9 C  SpO2: 100% 100%    Last Pain:  Vitals:   11/07/23 0943  TempSrc:   PainSc: 0-No pain                 Earvin Goldberg

## 2023-11-07 NOTE — Anesthesia Preprocedure Evaluation (Signed)
 Anesthesia Evaluation  Patient identified by MRN, date of birth, ID band Patient awake    Reviewed: Allergy & Precautions, H&P , NPO status , Patient's Chart, lab work & pertinent test results  Airway Mallampati: II  TM Distance: >3 FB Neck ROM: Full    Dental no notable dental hx.    Pulmonary neg pulmonary ROS, Current Smoker and Patient abstained from smoking.   Pulmonary exam normal breath sounds clear to auscultation       Cardiovascular negative cardio ROS Normal cardiovascular exam Rhythm:Regular Rate:Normal     Neuro/Psych negative neurological ROS  negative psych ROS   GI/Hepatic negative GI ROS, Neg liver ROS,,,  Endo/Other  negative endocrine ROS    Renal/GU negative Renal ROS  negative genitourinary   Musculoskeletal  (+) Arthritis , Osteoarthritis,    Abdominal   Peds negative pediatric ROS (+)  Hematology negative hematology ROS (+)   Anesthesia Other Findings Breast Cancer  Reproductive/Obstetrics negative OB ROS                             Anesthesia Physical Anesthesia Plan  ASA: 3  Anesthesia Plan: General   Post-op Pain Management: Regional block*, Tylenol PO (pre-op)* and Gabapentin PO (pre-op)*   Induction: Intravenous  PONV Risk Score and Plan: 2 and Ondansetron, Midazolam and Treatment may vary due to age or medical condition  Airway Management Planned: LMA  Additional Equipment:   Intra-op Plan:   Post-operative Plan: Extubation in OR  Informed Consent: I have reviewed the patients History and Physical, chart, labs and discussed the procedure including the risks, benefits and alternatives for the proposed anesthesia with the patient or authorized representative who has indicated his/her understanding and acceptance.     Dental advisory given  Plan Discussed with: CRNA  Anesthesia Plan Comments:        Anesthesia Quick Evaluation

## 2023-11-07 NOTE — Op Note (Signed)
 11/07/2023  9:00 AM  PATIENT:  Erika Lopez  58 y.o. female  PRE-OPERATIVE DIAGNOSIS:  RIGHT BREAST CANCER  POST-OPERATIVE DIAGNOSIS:  RIGHT BREAST CANCER  PROCEDURE:  Procedure(s) with comments: RIGHT BREAST LUMPECTOMY AND SENTINEL NODE BIOPSY  SURGEON:  Surgeons and Role:    * Caralyn Chandler, MD - Primary  PHYSICIAN ASSISTANT:   ASSISTANTS: none   ANESTHESIA:   local and general  EBL:  5 mL   BLOOD ADMINISTERED:none  DRAINS: none   LOCAL MEDICATIONS USED:  MARCAINE     SPECIMEN:  Source of Specimen:  right breast tissue and sentinel node x 3  DISPOSITION OF SPECIMEN:  PATHOLOGY  COUNTS:  YES  TOURNIQUET:  * No tourniquets in log *  DICTATION: .Dragon Dictation  After informed consent was obtained the patient was brought to the operating room and placed in the supine position on the operating table.  After adequate induction of general anesthesia the patient's right chest, breast, and axillary area were prepped with ChloraPrep, allowed to dry, and draped in usual sterile manner.  An appropriate timeout was performed.  Earlier in the day the patient underwent injection of 1 mCi technetium sulfur colloid in the subareolar position on the right.  Attention was first turned to the right axilla.  The neoprobe was set to technetium and a good signal was identified in the right axilla.  The area overlying this was infiltrated with quarter percent Marcaine.  A small transversely oriented incision was made with a 15 blade knife overlying the area of radioactivity.  The incision was carried through the skin and subcutaneous tissue sharply with electrocautery until the deep right axillary space was entered.  The neoprobe was used to direct blunt hemostat dissection.  I was able to identify a total of 3 lymph nodes with signal.  Each of these was excised sharply with the electrocautery and the surrounding small vessels and lymphatics were controlled with clips.  Ex vivo counts on these  nodes ranged from [phone].  No other hot or palpable nodes were identified in the right axilla.  Hemostasis was achieved using the Bovie electrocautery.  The deep layer of the axilla was then closed with interrupted 3-0 Vicryl stitches.  The skin was then closed with a running 4-0 Monocryl subcuticular stitch.  Attention was then turned to the right breast.  The patient has completed neoadjuvant chemotherapy but the cancer is still palpable although significantly smaller.  She also had a skin lesion that was removed and positive for breast cancer.  I made a radially oriented elliptical incision overlying the palpable mass in incorporating the previous skin excision with a 15 blade knife.  The incision was carried through the skin and subcutaneous tissue sharply with electrocautery.  Dissection was then carried through the breast tissue around the palpable mass and all the way to the muscle of the chest wall.  Once this tissue was removed it was oriented with the appropriate pink colors.  A specimen radiograph was obtained that showed the clip to be near the center of the specimen.  The specimen was then sent to pathology for further evaluation.  Hemostasis was achieved using the Bovie electrocautery.  The wound was irrigated with saline and infiltrated with more quarter percent Marcaine.  The deep layer of the incision was then closed with interrupted 3-0 Vicryl stitches.  The skin was then closed with interrupted 4-0 Monocryl subcuticular stitches.  Dermabond dressings were applied.  The patient tolerated the procedure well.  At  the end of the case all needle sponge and instrument counts were correct.  The patient was then awakened and taken to recovery in stable condition.  PLAN OF CARE: Discharge to home after PACU  PATIENT DISPOSITION:  PACU - hemodynamically stable.   Delay start of Pharmacological VTE agent (>24hrs) due to surgical blood loss or risk of bleeding: not applicable

## 2023-11-07 NOTE — Anesthesia Procedure Notes (Signed)
 Anesthesia Regional Block: Pectoralis block   Pre-Anesthetic Checklist: , timeout performed,  Correct Patient, Correct Site, Correct Laterality,  Correct Procedure, Correct Position, site marked,  Risks and benefits discussed,  Surgical consent,  Pre-op evaluation,  At surgeon's request and post-op pain management  Laterality: Right  Prep: chloraprep       Needles:  Injection technique: Single-shot  Needle Type: Stimiplex     Needle Length: 9cm  Needle Gauge: 21     Additional Needles:   Procedures:,,,, ultrasound used (permanent image in chart),,    Narrative:  Start time: 11/07/2023 7:22 AM End time: 11/07/2023 7:27 AM Injection made incrementally with aspirations every 5 mL.  Performed by: Personally  Anesthesiologist: Earvin Goldberg, MD

## 2023-11-07 NOTE — H&P (Signed)
 MRN: [id number] DOB: [birth year] Subjective   Chief Complaint: Follow-up  History of Present Illness: Erika Lopez is a 58 y.o. female who is seen today as an office consultation for evaluation of Follow-up  We are asked to see the patient in consultation by Dr. Pamelia Hoit to evaluate her for a new right breast cancer. The patient is a 58 year old black female who was first diagnosed with a right breast cancer back in July. At that time the cancer was smaller. The cancer was triple negative with a Ki-67 of 90%. She did not start chemotherapy until October and by that time the mass had grown to 3.3 cm and she had a satellite skin lesion that was biopsied and positive for breast cancer. She had 1 lymph node biopsy that came back normal and 1 intramammary lymph node that was also biopsied and came back normal. She was unhappy with her care so she had all of it transferred to [facility]. Since that time she has been getting a different neoadjuvant chemotherapy and the cancer seems to be shrinking. Her last dose of chemotherapy was on March 13. Her recent MRI showed the cancer to measure 1.2 cm. The lymph nodes still look normal  Review of Systems: A complete review of systems was obtained from the patient. I have reviewed this information and discussed as appropriate with the patient. See HPI as well for other ROS.  ROS   Medical History: Past Medical History:  Diagnosis Date  Anxiety  History of cancer   Patient Active Problem List  Diagnosis  Malignant neoplasm of lower-outer quadrant of right breast of female, estrogen receptor negative (CMS/HHS-HCC)   History reviewed. No pertinent surgical history.   No Known Allergies  No current outpatient medications on file prior to visit.   No current facility-administered medications on file prior to visit.   History reviewed. No pertinent family history.   Social History   Tobacco Use  Smoking Status Former  Types: Cigarettes  Start date:  2024  Smokeless Tobacco Never    Social History   Socioeconomic History  Marital status: Unknown  Tobacco Use  Smoking status: Former  Types: Cigarettes  Start date: 2024  Smokeless tobacco: Never  Substance and Sexual Activity  Alcohol use: Not Currently  Drug use: Never   Social Drivers of Health   Food Insecurity: Food Insecurity Present (08/23/2023)  Received from [facility]  Hunger Vital Sign  Worried About Running Out of Food in the Last Year: Often true  Ran Out of Food in the Last Year: Sometimes true  Transportation Needs: No Transportation Needs (08/04/2023)  Received from [facility] - Transportation  Lack of Transportation (Medical): No  Lack of Transportation (Non-Medical): No  Housing Stability: High Risk (08/23/2023)  Received from [facility] Stability Vital Sign  Unable to Pay for Housing in the Last Year: Yes  Number of Times Moved in the Last Year: 2  Homeless in the Last Year: Yes   Objective:   Vitals:  PainSc: 0-No pain   There is no height or weight on file to calculate BMI.  Physical Exam Vitals reviewed.  Constitutional:  General: She is not in acute distress. Appearance: Normal appearance.  HENT:  Head: Normocephalic and atraumatic.  Right Ear: External ear normal.  Left Ear: External ear normal.  Nose: Nose normal.  Mouth/Throat:  Mouth: Mucous membranes are moist.  Pharynx: Oropharynx is clear.  Eyes:  General: No scleral icterus. Extraocular Movements: Extraocular movements intact.  Conjunctiva/sclera: Conjunctivae normal.  Pupils: Pupils are equal, round, and reactive to light.  Cardiovascular:  Rate and Rhythm: Normal rate and regular rhythm.  Pulses: Normal pulses.  Heart sounds: Normal heart sounds.  Pulmonary:  Effort: Pulmonary effort is normal. No respiratory distress.  Breath sounds: Normal breath sounds.  Abdominal:  General: Bowel sounds are normal.  Palpations: Abdomen is soft.  Tenderness:  There is no abdominal tenderness.  Musculoskeletal:  General: No swelling, tenderness or deformity. Normal range of motion.  Cervical back: Normal range of motion and neck supple.  Skin: General: Skin is warm and dry.  Coloration: Skin is not jaundiced.  Neurological:  General: No focal deficit present.  Mental Status: She is alert and oriented to person, place, and time.  Psychiatric:  Mood and Affect: Mood normal.  Behavior: Behavior normal.     Breast: The mass in the lower outer right breast feels like it is about 1 cm and it is mobile and not tethered to the skin of the chest wall. Just outside of this was the skin lesion that has been excised. The scar is well-healed. There is no palpable mass of the left breast. There is no palpable axillary, supraclavicular, or cervical lymphadenopathy.  Labs, Imaging and Diagnostic Testing:  Assessment and Plan:   Diagnoses and all orders for this visit:  Malignant neoplasm of lower-outer quadrant of right breast of female, estrogen receptor negative (CMS/HHS-HCC)   The patient appears to have a 3.3 cm cancer in the lower outer quadrant of the right breast with a positive skin satellite lesion and negative lymph nodes. She has finished chemotherapy. We have discussed in detail the different options for treatment and at this point she favors breast conservation which I feel is very reasonable. She will not need a seed placed since the cancer is still palpable although much smaller. She will be a good candidate for sentinel node biopsy. I have discussed with her in detail the risks and benefits of the operation as well as some of the technical aspects and she understands and wishes to proceed. We will move forward with surgical scheduling.

## 2023-11-08 ENCOUNTER — Encounter (HOSPITAL_COMMUNITY): Payer: Self-pay | Admitting: General Surgery

## 2023-11-08 ENCOUNTER — Encounter: Payer: Self-pay | Admitting: Licensed Clinical Social Worker

## 2023-11-08 NOTE — Progress Notes (Signed)
 [facility] CSW Progress Note  Clinical Child psychotherapist received message from patient informing that pt was approved for her social security disability.     Naszir Cott E Nilani Hugill, LCSW Clinical Social Worker [facility]

## 2023-11-09 NOTE — Progress Notes (Signed)
 New Breast Cancer Diagnosis: Right Breast LOQ  Patient was initially diagnosed in July 2024 after mammogram.  Her cancer was triple negative with a Ki67 of 90%.  She did not start chemotherapy until October 2024 and unfortunately her mass had grown to 3.3 cm and a satellite skin lesion that was biopsied and was positive.   Histology per Pathology Report: grade 3, Invasive Ductal Carcinoma 11/07/2023  Receptor Status: ER(negative), PR (negative), Her2-neu (negative), Ki-(90%)   Surgeon and surgical plan, if any:  Dr. Alethea Andes -Right Breast Lumpectomy SLN biopsy 11/07/2023 -Follow-up 11/28/2023  Medical oncologist, treatment if any:   Dr. Lee Public 10/05/2023 Treatment plan: Neoadjuvant chemotherapy with Taxol carbo Keytruda  every 3 weeks x 4 (completed at [facility]), received first cycle of Adriamycin  Cytoxan  Keytruda  on 08/01/2022: Plan to complete keynote 522 regimen with cycle 2 Adriamycin  Cytoxan  Keytruda  to be given 08/23/2023, followed by Keytruda  maintenance Followed by breast conserving surgery with targeted node dissection Adjuvant radiation therapy    Family History of Breast/Ovarian/Prostate Cancer: Paternal Cousin has prostate  cancer, paternal grandmother had breast cancer.   Lymphedema issues, if any: No      Pain issues, if any: She reports some tenderness remaining from surgery.     SAFETY ISSUES: Prior radiation? No  Pacemaker/ICD?  No Possible current pregnancy? Postmenopausal Is the patient on methotrexate? No  Current Complaints / other details:

## 2023-11-10 ENCOUNTER — Encounter: Payer: Self-pay | Admitting: *Deleted

## 2023-11-14 ENCOUNTER — Ambulatory Visit
Admission: RE | Admit: 2023-11-14 | Discharge: 2023-11-14 | Disposition: A | Discharge status: Home / Self Care | Source: Ambulatory Visit | Attending: Radiation Oncology | Admitting: Radiation Oncology

## 2023-11-14 ENCOUNTER — Encounter: Payer: Self-pay | Admitting: Radiation Oncology

## 2023-11-14 ENCOUNTER — Ambulatory Visit
Admission: RE | Admit: 2023-11-14 | Discharge: 2023-11-14 | Discharge status: Home / Self Care | Source: Ambulatory Visit | Attending: Radiation Oncology | Admitting: Radiation Oncology

## 2023-11-14 VITALS — Ht 70.0 in | Wt 172.0 lb

## 2023-11-14 DIAGNOSIS — C50511 Malignant neoplasm of lower-outer quadrant of right female breast: Secondary | ICD-10-CM

## 2023-11-14 DIAGNOSIS — Z171 Estrogen receptor negative status [ER-]: Secondary | ICD-10-CM | POA: Diagnosis not present

## 2023-11-15 ENCOUNTER — Encounter: Payer: Self-pay | Admitting: *Deleted

## 2023-11-15 DIAGNOSIS — C50511 Malignant neoplasm of lower-outer quadrant of right female breast: Secondary | ICD-10-CM | POA: Diagnosis not present

## 2023-11-15 DIAGNOSIS — Z171 Estrogen receptor negative status [ER-]: Secondary | ICD-10-CM | POA: Diagnosis not present

## 2023-11-15 NOTE — Progress Notes (Signed)
 Received fax for attestation of medical need for personal care services through pt insurance.  Pt states she experiences fatigue and shortness of breath with activity.  Form completed and successfully faxed back to [phone].

## 2023-11-15 NOTE — Progress Notes (Signed)
 Radiation Oncology         [phone]) [phone] ________________________________  Name: Erika Lopez MRN: [id number]  Date: 11/14/2023  DOB: 06/26/1966  NG:[id number], No Pcp Per  Cameron Cea, MD     REFERRING PHYSICIAN: Cameron Cea, MD   DIAGNOSIS: The encounter diagnosis was Malignant neoplasm of lower-outer quadrant of right breast of female, estrogen receptor negative (HCC).   HISTORY OF PRESENT ILLNESS::Erika Lopez is a 58 y.o. female who is seen for an initial consultation visit regarding the patient's diagnosis of right-sided breast cancer.  The patient was initially found to have a 3.3 cm tumor within the right breast with a couple of suspicious lymph nodes also noticed.  Biopsy of a suspicious lymph node was negative for carcinoma but this was felt to be discordant based on her imaging.  The patient also was noted to have a satellite skin lesion which was excised and this did confirm carcinoma pathologically.  The breast tumor itself was found to represent a triple negative invasive ductal carcinoma.  The patient proceeded with initial neoadjuvant chemotherapy which has been followed with a lumpectomy on 11/07/2023.  This revealed a grade 3 invasive ductal carcinoma measuring 1.8 cm.  Margins were negative.  3 sentinel lymph nodes all were negative for carcinoma.  I been asked to see the patient for consideration of adjuvant radiation treatment at this time.    PREVIOUS RADIATION THERAPY: No   PAST MEDICAL HISTORY:  has a past medical history of Arthritis, Back pain, Cancer (HCC), DDD (degenerative disc disease), lumbosacral, DDD (degenerative disc disease), thoracic, DDD (degenerative disc disease), thoracolumbar, and Dysrhythmia (2024).     PAST SURGICAL HISTORY: Past Surgical History:  Procedure Laterality Date   ABLATION     BREAST LUMPECTOMY Right 11/07/2023   Procedure: BREAST LUMPECTOMY;  Surgeon: Caralyn Chandler, MD;  Location: [facility] OR;  Service: General;  Laterality: Right;   RIGHT BREAST LUMPECTOMY AND SENTINEL NODE BIOPSY   CHOLECYSTECTOMY     SENTINEL NODE BIOPSY Right 11/07/2023   Procedure: BIOPSY, LYMPH NODE, SENTINEL;  Surgeon: Caralyn Chandler, MD;  Location: [facility] OR;  Service: General;  Laterality: Right;   TUBAL LIGATION       FAMILY HISTORY: family history includes Breast cancer in her paternal grandmother; Prostate cancer in her cousin.   SOCIAL HISTORY:  reports that she has quit smoking. Her smoking use included cigarettes. She has never used smokeless tobacco. She reports current alcohol use. She reports that she does not use drugs.   ALLERGIES: Wound dressing adhesive   MEDICATIONS:  Current Outpatient Medications  Medication Sig Dispense Refill   diphenhydrAMINE (BENADRYL) 50 MG capsule Take 50 mg by mouth every 6 (six) hours as needed for allergies or itching.     gabapentin  (NEURONTIN ) 100 MG capsule Take 1 capsule (100 mg total) by mouth 3 (three) times daily. 90 capsule 0   metoprolol  tartrate (LOPRESSOR ) 25 MG tablet Take 1 tablet (25 mg total) by mouth 2 (two) times daily. (Patient taking differently: Take 25 mg by mouth 2 (two) times daily as needed (fast heart rate).) 60 tablet 3   oxyCODONE  (ROXICODONE ) 5 MG immediate release tablet Take 1 tablet (5 mg total) by mouth every 6 (six) hours as needed for severe pain (pain score 7-10). 10 tablet 0   No current facility-administered medications for this encounter.     REVIEW OF SYSTEMS:  A 15 point review of systems is documented in the electronic medical record. This was obtained by the nursing  staff. However, I reviewed this with the patient to discuss relevant findings and make appropriate changes.  Pertinent items are noted in HPI.    PHYSICAL EXAM:  height is 5\' 10"  (1.778 m) and weight is 172 lb (78 kg).   ECOG = 0  0 - Asymptomatic (Fully active, able to carry on all predisease activities without restriction)  1 - Symptomatic but completely ambulatory (Restricted in physically  strenuous activity but ambulatory and able to carry out work of a light or sedentary nature. For example, light housework, office work)  2 - Symptomatic, <50% in bed during the day (Ambulatory and capable of all self care but unable to carry out any work activities. Up and about more than 50% of waking hours)  3 - Symptomatic, >50% in bed, but not bedbound (Capable of only limited self-care, confined to bed or chair 50% or more of waking hours)  4 - Bedbound (Completely disabled. Cannot carry on any self-care. Totally confined to bed or chair)  5 - Death   Aurea Blossom MM, Creech RH, Tormey DC, et al. [phone]). "Toxicity and response criteria of the [facility]". Am. Hillard [employer]. Oncol. 5 (6): 649-55  Alert, no distress   LABORATORY DATA:  Lab Results  Component Value Date   WBC 5.7 11/03/2023   HGB 10.7 (L) 11/03/2023   HCT 33.2 (L) 11/03/2023   MCV 88.5 11/03/2023   PLT 324 11/03/2023   Lab Results  Component Value Date   NA 140 11/03/2023   K 4.1 11/03/2023   CL 105 11/03/2023   CO2 26 11/03/2023   Lab Results  Component Value Date   ALT 23 10/05/2023   AST 22 10/05/2023   ALKPHOS 83 10/05/2023   BILITOT 0.3 10/05/2023      RADIOGRAPHY: NM Sentinel Node Inj-No Rpt (Breast) Result Date: 11/07/2023 Lymphoseek was injected by the Nuclear Medicine Technologist for sentinel lymph node localization.       IMPRESSION/ PLAN:  The patient underwent evaluation today for her diagnosis of right-sided breast cancer.  The patient is appropriate to proceed with adjuvant radiation treatment at this time.  We discussed the rationale for treatment and the potential improvement in local control.  Given the patient's clinical context, we discussed a course of standard hypofractionation to the right breast with possible high tangent treatment fields.  Initially there were a couple of suspicious lymph nodes on imaging, with discordant negative findings on biopsy, and ultimately 3  negative sentinel lymph nodes after neoadjuvant chemotherapy.  Given all of this, I do not believe that we would need to fully treat the regional lymph nodes with additional treatment fields, but treating the low axilla certainly would be reasonable given the initial suspicious findings and given the aggressive nature of the tumor which is triple negative.  All of her questions were answered today.  And she does wish to proceed with adjuvant radiation treatment at the appropriate time.  She indicated that she would like to begin her course of treatment in early June which I think would be fine.  We will proceed with simulation for treatment planning towards the end of May.    This encounter was conducted via telephone at the patient's request.  The patient has given verbal consent for this type of encounter. The time spent during this encounter was 60 minutes and more than 50% of that time was spent in the coordination of the patient's care. The attendants for this meeting included Dr. Jeryl Moris, and the  patient.  During the encounter Dr. Jeryl Moris was located at [facility] Radiation Oncology Department.  The patient  was located at home.        ________________________________   Alix Isaac, MD, PhD   **Disclaimer: This note was dictated with voice recognition software. Similar sounding words can inadvertently be transcribed and this note may contain transcription errors which may not have been corrected upon publication of note.**

## 2023-11-16 ENCOUNTER — Other Ambulatory Visit: Payer: Self-pay

## 2023-11-20 ENCOUNTER — Inpatient Hospital Stay: Attending: Hematology and Oncology | Admitting: Hematology and Oncology

## 2023-11-20 ENCOUNTER — Encounter: Payer: Self-pay | Admitting: *Deleted

## 2023-11-20 NOTE — Assessment & Plan Note (Deleted)
 02/22/2023: [facility]: Palpable mass, mammogram detected right breast mass 5 cm from the nipple 1.6 cm, separate oval circumscribed hypoechoic mass 7 mm (?  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%, skin satellite nodule biopsy positive   Treatment plan: Neoadjuvant chemotherapy with Taxol carbo Keytruda  every 3 weeks x 4 (completed at [facility]), received first cycle of Adriamycin  Cytoxan  Keytruda  on 08/01/2022: Plan to complete keynote 522 regimen with cycle 2 Adriamycin  Cytoxan  Keytruda  to be given 08/23/2023, followed by Keytruda  maintenance Followed by breast conserving surgery with targeted node dissection 11/07/2023: Residual IDC grade 3, 1.8 cm, margins negative, 0/3 lymph nodes negative Adjuvant radiation therapy ----------------------------------------------------------------------------------------------------------------------------------------------------- Discussion: Based upon not having a complete pathologic response, I discussed with the patient that it indicates a higher risk of relapse.  I recommended that she participate in clinical trial adding Sacituzumab-Govitecan to Keytruda  (ASCENT 05) Recommended continuation of Keytruda  maintenance.

## 2023-11-22 ENCOUNTER — Encounter: Payer: Self-pay | Admitting: General Surgery

## 2023-11-22 ENCOUNTER — Other Ambulatory Visit: Payer: Self-pay | Admitting: *Deleted

## 2023-11-23 ENCOUNTER — Telehealth: Payer: Self-pay | Admitting: *Deleted

## 2023-11-23 NOTE — Telephone Encounter (Signed)
 Received call from pt requesting shower chair due to shortness of breath with exertion.  Verbal orders received from MD and placed through [facility].

## 2023-11-28 DIAGNOSIS — C50511 Malignant neoplasm of lower-outer quadrant of right female breast: Secondary | ICD-10-CM | POA: Diagnosis not present

## 2023-11-28 DIAGNOSIS — M6281 Muscle weakness (generalized): Secondary | ICD-10-CM | POA: Diagnosis not present

## 2023-12-04 DIAGNOSIS — Z741 Need for assistance with personal care: Secondary | ICD-10-CM | POA: Diagnosis not present

## 2023-12-05 DIAGNOSIS — Z741 Need for assistance with personal care: Secondary | ICD-10-CM | POA: Diagnosis not present

## 2023-12-06 DIAGNOSIS — Z741 Need for assistance with personal care: Secondary | ICD-10-CM | POA: Diagnosis not present

## 2023-12-07 DIAGNOSIS — Z741 Need for assistance with personal care: Secondary | ICD-10-CM | POA: Diagnosis not present

## 2023-12-08 DIAGNOSIS — Z741 Need for assistance with personal care: Secondary | ICD-10-CM | POA: Diagnosis not present

## 2023-12-09 DIAGNOSIS — Z741 Need for assistance with personal care: Secondary | ICD-10-CM | POA: Diagnosis not present

## 2023-12-10 DIAGNOSIS — Z741 Need for assistance with personal care: Secondary | ICD-10-CM | POA: Diagnosis not present

## 2023-12-11 DIAGNOSIS — Z741 Need for assistance with personal care: Secondary | ICD-10-CM | POA: Diagnosis not present

## 2023-12-12 DIAGNOSIS — Z741 Need for assistance with personal care: Secondary | ICD-10-CM | POA: Diagnosis not present

## 2023-12-13 DIAGNOSIS — Z741 Need for assistance with personal care: Secondary | ICD-10-CM | POA: Diagnosis not present

## 2023-12-14 DIAGNOSIS — Z741 Need for assistance with personal care: Secondary | ICD-10-CM | POA: Diagnosis not present

## 2023-12-15 DIAGNOSIS — Z741 Need for assistance with personal care: Secondary | ICD-10-CM | POA: Diagnosis not present

## 2023-12-15 NOTE — Progress Notes (Unsigned)
 Subjective:     Patient ID: Erika Lopez, female    DOB: [birth year], 58 y.o.   MRN: [id number]  No chief complaint on file.   HPI  Erika Lopez is a 58 y.o. female who presents to establish care  She has active   History of Present Illness              Health Maintenance Due  Topic Date Due   COVID-19 Vaccine (1) Never done   HIV Screening  Never done   Hepatitis C Screening  Never done   DTaP/Tdap/Td (1 - Tdap) Never done   Pneumococcal Vaccine 20-59 Years old (1 of 2 - PCV) Never done   Zoster Vaccines- Shingrix (1 of 2) Never done   Colonoscopy  Never done   Lung Cancer Screening  Never done    Past Medical History:  Diagnosis Date   Arthritis    Back pain    Cancer (HCC)    DDD (degenerative disc disease), lumbosacral    DDD (degenerative disc disease), thoracic    DDD (degenerative disc disease), thoracolumbar    Dysrhythmia 2024   per pt high HR due to cancer tx    Past Surgical History:  Procedure Laterality Date   ABLATION     BREAST LUMPECTOMY Right 11/07/2023   Procedure: BREAST LUMPECTOMY;  Surgeon: Caralyn Chandler, MD;  Location: [facility] OR;  Service: General;  Laterality: Right;  RIGHT BREAST LUMPECTOMY AND SENTINEL NODE BIOPSY   CHOLECYSTECTOMY     SENTINEL NODE BIOPSY Right 11/07/2023   Procedure: BIOPSY, LYMPH NODE, SENTINEL;  Surgeon: Caralyn Chandler, MD;  Location: [facility] OR;  Service: General;  Laterality: Right;   TUBAL LIGATION      Family History  Problem Relation Age of Onset   Breast cancer Paternal Grandmother    Prostate cancer Cousin     Social History   Socioeconomic History   Marital status: Legally Separated    Spouse name: Not on file   Number of children: Not on file   Years of education: Not on file   Highest education level: Not on file  Occupational History   Not on file  Tobacco Use   Smoking status: Former    Types: Cigarettes   Smokeless tobacco: Never  Vaping Use   Vaping status: Never Used  Substance and  Sexual Activity   Alcohol use: Yes    Comment: occ   Drug use: No   Sexual activity: Not on file  Other Topics Concern   Not on file  Social History Narrative   Not on file   Social Drivers of Health   Financial Resource Strain: Not on file  Food Insecurity: Food Insecurity Present (08/23/2023)   Hunger Vital Sign    Worried About Running Out of Food in the Last Year: Often true    Ran Out of Food in the Last Year: Sometimes true  Transportation Needs: No Transportation Needs (08/04/2023)   PRAPARE - Administrator, Civil Service (Medical): No    Lack of Transportation (Non-Medical): No  Physical Activity: Not on file  Stress: Not on file  Social Connections: Not on file  Intimate Partner Violence: Not on file    Outpatient Medications Prior to Visit  Medication Sig Dispense Refill   diphenhydrAMINE (BENADRYL) 50 MG capsule Take 50 mg by mouth every 6 (six) hours as needed for allergies or itching.     gabapentin  (NEURONTIN ) 100 MG capsule Take 1 capsule (100  mg total) by mouth 3 (three) times daily. 90 capsule 0   metoprolol  tartrate (LOPRESSOR ) 25 MG tablet Take 1 tablet (25 mg total) by mouth 2 (two) times daily. (Patient taking differently: Take 25 mg by mouth 2 (two) times daily as needed (fast heart rate).) 60 tablet 3   oxyCODONE  (ROXICODONE ) 5 MG immediate release tablet Take 1 tablet (5 mg total) by mouth every 6 (six) hours as needed for severe pain (pain score 7-10). 10 tablet 0   No facility-administered medications prior to visit.    Allergies  Allergen Reactions   Wound Dressing Adhesive Rash    ROS     Objective:     Physical Exam   There were no vitals taken for this visit. Wt Readings from Last 3 Encounters:  11/14/23 172 lb (78 kg)  11/07/23 172 lb (78 kg)  11/03/23 171 lb 1.6 oz (77.6 kg)       Assessment & Plan:   Problem List Items Addressed This Visit   None   I am having Erika Lopez maintain her gabapentin , metoprolol   tartrate, oxyCODONE , and diphenhydrAMINE.  No orders of the defined types were placed in this encounter.

## 2023-12-16 DIAGNOSIS — Z741 Need for assistance with personal care: Secondary | ICD-10-CM | POA: Diagnosis not present

## 2023-12-17 ENCOUNTER — Other Ambulatory Visit: Payer: Self-pay

## 2023-12-17 DIAGNOSIS — Z741 Need for assistance with personal care: Secondary | ICD-10-CM | POA: Diagnosis not present

## 2023-12-18 DIAGNOSIS — Z741 Need for assistance with personal care: Secondary | ICD-10-CM | POA: Diagnosis not present

## 2023-12-18 DIAGNOSIS — R739 Hyperglycemia, unspecified: Secondary | ICD-10-CM | POA: Insufficient documentation

## 2023-12-18 DIAGNOSIS — R7989 Other specified abnormal findings of blood chemistry: Secondary | ICD-10-CM | POA: Insufficient documentation

## 2023-12-18 NOTE — Patient Instructions (Signed)
 Thank you for choosing [facility] at [facility] for your Primary Care needs. I am excited for the opportunity to partner with you to meet your health care goals. It was a pleasure meeting you today!  Information on diet, exercise, and health maintenance recommendations are listed below. This is information to help you be sure you are on track for optimal health and monitoring.   Please look over this and let us  know if you have any questions or if you have completed any of the health maintenance outside of [facility] so that we can be sure your records are up to date.  ___________________________________________________________  MyChart:  For all urgent or time sensitive needs we ask that you please call the office to avoid delays. Our number is [phone]. MyChart is not constantly monitored and due to the large volume of messages a day, replies may take up to 72 business hours.  MyChart Policy: MyChart allows for you to see your visit notes, after visit summary, provider recommendations, lab and tests results, make an appointment, request refills, and contact your provider or the office for non-urgent questions or concerns. Providers are seeing patients during normal business hours and do not have built in time to review MyChart messages.  We ask that you allow a minimum of 3 business days for responses to [employer]. For this reason, please do not send urgent requests through MyChart. Please call the office at [phone]. New and ongoing conditions may require a visit. We have virtual and in-person visits available for your convenience.  Complex MyChart concerns may require a visit. Your provider may request you schedule a virtual or in-person visit to ensure we are providing the best care possible. MyChart messages sent after 11:00 AM on Friday may not be received by the provider until Monday morning.    Lab and Test Results: You will receive your lab and test  results on MyChart as soon as they are completed and results have been sent by the lab or testing facility. Due to this service, you will receive your results BEFORE your provider.  I review lab and test results each morning prior to seeing patients. Some results require collaboration with other providers to ensure you are receiving the most appropriate care. For this reason, we ask that you please allow a minimum of 3-5 business days from the time that ALL results have been received for your provider to receive and review lab and test results and contact you about these.  Most lab and test result comments from the provider will be sent through MyChart. Your provider may recommend changes to the plan of care, follow-up visits, repeat testing, ask questions, or request an office visit to discuss these results. You may reply directly to this message or call the office to provide information for the provider or set up an appointment. In some instances, you will be called with test results and recommendations. Please let us  know if this is preferred and we will make note of this in your chart to provide this for you.    If you have not heard a response to your lab or test results in 5 business days from all results returning to MyChart, please call the office to let us  know. We ask that you please avoid calling prior to this time unless there is an emergent concern. Due to high call volumes, this can delay the resulting process.  After Hours: For all non-emergency after hours needs, please  call the office at [phone] and select the option to reach the on-call  service. On-call services are shared between multiple [place] offices and therefore it will not be possible to speak directly with your provider. On-call providers may provide medical advice and recommendations, but are unable to provide refills for maintenance medications.  For all emergency or urgent medical needs after normal business hours, we  recommend that you seek care at the closest Urgent Care or Emergency Department to ensure appropriate treatment in a timely manner.  [facility] has a 24 hour emergency room located on the ground floor for your convenience.   Urgent Concerns During the Business Day Providers are seeing patients from 8AM to 5PM with a busy schedule and are most often not able to respond to non-urgent calls until the end of the day or the next business day. If you should have URGENT concerns during the day, please call and speak to the nurse or schedule a same day appointment so that we can address your concern without delay.   Thank you, again, for choosing me as your health care partner. I appreciate your trust and look forward to learning more about you!   Melville Stade, DNP, AGNP-C  ___________________________________________________________  Health Maintenance Recommendations Screening Testing Mammogram Every 1-2 years based on history and risk factors Starting at age 5 Pap Smear Ages 21-39 every 3 years Ages 83-65 every 5 years with HPV testing More frequent testing may be required based on results and history Colon Cancer Screening Every 1-10 years based on test performed, risk factors, and history Starting at age 82 Bone Density Screening Every 2-10 years based on history Starting at age 73 for women Recommendations for men differ based on medication usage, history, and risk factors AAA Screening One time ultrasound Men 37-37 years old who have ever smoked Lung Cancer Screening Low Dose Lung CT every 12 months Age 50-80 years with a 20 pack-year smoking history who still smoke or who have quit within the last 15 years  Screening Labs Routine  Labs: Complete Blood Count (CBC), Complete Metabolic Panel (CMP), Cholesterol (Lipid Panel) Every 6-12 months based on history and medications May be recommended more frequently based on current conditions or previous results Hemoglobin  A1c Lab Every 3-12 months based on history and previous results Starting at age 36 or earlier with diagnosis of diabetes, high cholesterol, BMI >26, and/or risk factors Frequent monitoring for patients with diabetes to ensure blood sugar control Thyroid  Panel  Every 6 months based on history, symptoms, and risk factors May be repeated more often if on medication HIV One time testing for all patients 74 and older May be repeated more frequently for patients with increased risk factors or exposure Hepatitis C One time testing for all patients 53 and older May be repeated more frequently for patients with increased risk factors or exposure Gonorrhea, Chlamydia Every 12 months for all sexually active persons 13-24 years Additional monitoring may be recommended for those who are considered high risk or who have symptoms PSA Men 16-5 years old with risk factors Additional screening may be recommended from age 49-69 based on risk factors, symptoms, and history  Vaccine Recommendations Tetanus Booster All adults every 10 years Flu Vaccine All patients 6 months and older every year COVID Vaccine All patients 12 years and older Initial dosing with booster May recommend additional booster based on age and health history HPV Vaccine 2 doses all patients age 40-26 Dosing may be considered for  patients over 26 Shingles Vaccine (Shingrix) 2 doses all adults 50 years and older Pneumonia (Pneumovax 23) All adults 65 years and older May recommend earlier dosing based on health history Pneumonia (Prevnar 64) All adults 65 years and older Dosed 1 year after Pneumovax 23 Pneumonia (Prevnar 20) All adults 65 years and older (adults 19-64 with certain conditions or risk factors) 1 dose  For those who have not received Prevnar 13 vaccine previously   Additional Screening, Testing, and Vaccinations may be recommended on an individualized basis based on family history, health history, risk  factors, and/or exposure.  __________________________________________________________  Diet Recommendations for All Patients  I recommend that all patients maintain a diet low in saturated fats, carbohydrates, and cholesterol. While this can be challenging at first, it is not impossible and small changes can make big differences.  Things to try: Decreasing the amount of soda, sweet tea, and/or juice to one or less per day and replace with water While water is always the first choice, if you do not like water you may consider adding a water additive without sugar to improve the taste other sugar free drinks Replace potatoes with a brightly colored vegetable  Use healthy oils, such as canola oil or olive oil, instead of butter or hard margarine Limit your bread intake to two pieces or less a day Replace regular pasta with low carb pasta options Bake, broil, or grill foods instead of frying Monitor portion sizes  Eat smaller, more frequent meals throughout the day instead of large meals  An important thing to remember is, if you love foods that are not great for your health, you don't have to give them up completely. Instead, allow these foods to be a reward when you have done well. Allowing yourself to still have special treats every once in a while is a nice way to tell yourself thank you for working hard to keep yourself healthy.   Also remember that every day is a new day. If you have a bad day and "fall off the wagon", you can still climb right back up and keep moving along on your journey!  We have resources available to help you!  Some websites that may be helpful include: www.http://www.wall-moore.info/  Www.VeryWellFit.com _____________________________________________________________  Activity Recommendations for All Patients  I recommend that all adults get at least 30 minutes of moderate physical activity that elevates your heart rate at least 5 days out of the week.  Some examples  include: Walking or jogging at a pace that allows you to carry on a conversation Cycling (stationary bike or outdoors) Water aerobics Yoga Weight lifting Dancing If physical limitations prevent you from putting stress on your joints, exercise in a pool or seated in a chair are excellent options.  Do determine your MAXIMUM heart rate for activity: 220 - YOUR AGE = MAX Heart Rate   Remember! Do not push yourself too hard.  Start slowly and build up your pace, speed, weight, time in exercise, etc.  Allow your body to rest between exercise and get good sleep. You will need more water than normal when you are exerting yourself. Do not wait until you are thirsty to drink. Drink with a purpose of getting in at least 8, 8 ounce glasses of water a day plus more depending on how much you exercise and sweat.    If you begin to develop dizziness, chest pain, abdominal pain, jaw pain, shortness of breath, headache, vision changes, lightheadedness, or other concerning symptoms, stop  the activity and allow your body to rest. If your symptoms are severe, seek emergency evaluation immediately. If your symptoms are concerning, but not severe, please let us  know so that we can recommend further evaluation.

## 2023-12-19 ENCOUNTER — Ambulatory Visit (HOSPITAL_BASED_OUTPATIENT_CLINIC_OR_DEPARTMENT_OTHER)
Admission: RE | Admit: 2023-12-19 | Discharge: 2023-12-19 | Disposition: A | Discharge status: Home / Self Care | Source: Ambulatory Visit | Attending: Student | Admitting: Student

## 2023-12-19 ENCOUNTER — Other Ambulatory Visit (HOSPITAL_BASED_OUTPATIENT_CLINIC_OR_DEPARTMENT_OTHER): Payer: Self-pay

## 2023-12-19 ENCOUNTER — Ambulatory Visit: Admitting: Student

## 2023-12-19 ENCOUNTER — Encounter: Payer: Self-pay | Admitting: Student

## 2023-12-19 VITALS — BP 132/80 | HR 95 | Temp 98.7°F | Resp 12 | Ht 69.5 in | Wt 174.8 lb

## 2023-12-19 DIAGNOSIS — M25532 Pain in left wrist: Secondary | ICD-10-CM | POA: Diagnosis not present

## 2023-12-19 DIAGNOSIS — S52502A Unspecified fracture of the lower end of left radius, initial encounter for closed fracture: Secondary | ICD-10-CM | POA: Diagnosis not present

## 2023-12-19 DIAGNOSIS — L729 Follicular cyst of the skin and subcutaneous tissue, unspecified: Secondary | ICD-10-CM | POA: Insufficient documentation

## 2023-12-19 DIAGNOSIS — R7989 Other specified abnormal findings of blood chemistry: Secondary | ICD-10-CM

## 2023-12-19 DIAGNOSIS — R238 Other skin changes: Secondary | ICD-10-CM

## 2023-12-19 DIAGNOSIS — R739 Hyperglycemia, unspecified: Secondary | ICD-10-CM | POA: Diagnosis not present

## 2023-12-19 DIAGNOSIS — E7800 Pure hypercholesterolemia, unspecified: Secondary | ICD-10-CM | POA: Insufficient documentation

## 2023-12-19 DIAGNOSIS — Z741 Need for assistance with personal care: Secondary | ICD-10-CM | POA: Diagnosis not present

## 2023-12-19 DIAGNOSIS — S52615A Nondisplaced fracture of left ulna styloid process, initial encounter for closed fracture: Secondary | ICD-10-CM | POA: Diagnosis not present

## 2023-12-19 DIAGNOSIS — M25432 Effusion, left wrist: Secondary | ICD-10-CM

## 2023-12-19 DIAGNOSIS — Z7689 Persons encountering health services in other specified circumstances: Secondary | ICD-10-CM

## 2023-12-19 MED ORDER — MELOXICAM 7.5 MG PO TABS
7.5000 mg | ORAL_TABLET | Freq: Every day | ORAL | 0 refills | Status: DC
  Filled 2023-12-19: qty 14, 14d supply, fill #0

## 2023-12-19 NOTE — Assessment & Plan Note (Addendum)
 An X-ray of the wrist ordered to rule out a fracture. Consider ortho referral pending imaging results. The patient was encouraged to elevate, rest, ice, the wrist, as well as to take the prescribed anti-inflammatory medication. Medication side effects were discussed, and the patient voiced understanding. Pt advised to avoid overusing the wrist and was provided with a wrist brace. The patient was encouraged to use compression, such as an ACE bandage or the wrist brace, to help reduce swelling and immobilize wrist to promote healing.

## 2023-12-19 NOTE — Assessment & Plan Note (Signed)
Recheck CBC today. 

## 2023-12-19 NOTE — Assessment & Plan Note (Signed)
 Check CMP and A1C today.

## 2023-12-19 NOTE — Assessment & Plan Note (Signed)
 Multiple papules on the top of the scalp, newly developed following chemotherapy. The patient has been referred to dermatology for further evaluation.

## 2023-12-19 NOTE — Assessment & Plan Note (Signed)
 Referral to dermatology placed.

## 2023-12-20 DIAGNOSIS — Z741 Need for assistance with personal care: Secondary | ICD-10-CM | POA: Diagnosis not present

## 2023-12-21 ENCOUNTER — Ambulatory Visit
Admission: RE | Admit: 2023-12-21 | Discharge: 2023-12-21 | Disposition: A | Discharge status: Home / Self Care | Source: Ambulatory Visit | Attending: Radiation Oncology | Admitting: Radiation Oncology

## 2023-12-21 DIAGNOSIS — Z510 Encounter for antineoplastic radiation therapy: Secondary | ICD-10-CM | POA: Diagnosis present

## 2023-12-21 DIAGNOSIS — Z741 Need for assistance with personal care: Secondary | ICD-10-CM | POA: Diagnosis not present

## 2023-12-21 DIAGNOSIS — Z171 Estrogen receptor negative status [ER-]: Secondary | ICD-10-CM | POA: Diagnosis not present

## 2023-12-21 DIAGNOSIS — C50511 Malignant neoplasm of lower-outer quadrant of right female breast: Secondary | ICD-10-CM | POA: Diagnosis not present

## 2023-12-21 LAB — SURGICAL PATHOLOGY

## 2023-12-22 DIAGNOSIS — Z741 Need for assistance with personal care: Secondary | ICD-10-CM | POA: Diagnosis not present

## 2023-12-23 DIAGNOSIS — Z741 Need for assistance with personal care: Secondary | ICD-10-CM | POA: Diagnosis not present

## 2023-12-24 ENCOUNTER — Ambulatory Visit
Admission: RE | Admit: 2023-12-24 | Discharge: 2023-12-24 | Disposition: A | Discharge status: Home / Self Care | Source: Ambulatory Visit | Attending: Radiation Oncology | Admitting: Radiation Oncology

## 2023-12-24 DIAGNOSIS — C50511 Malignant neoplasm of lower-outer quadrant of right female breast: Secondary | ICD-10-CM | POA: Insufficient documentation

## 2023-12-24 DIAGNOSIS — Z171 Estrogen receptor negative status [ER-]: Secondary | ICD-10-CM | POA: Insufficient documentation

## 2023-12-24 DIAGNOSIS — Z510 Encounter for antineoplastic radiation therapy: Secondary | ICD-10-CM | POA: Insufficient documentation

## 2023-12-25 ENCOUNTER — Ambulatory Visit: Payer: Self-pay | Admitting: General Surgery

## 2023-12-26 DIAGNOSIS — C50511 Malignant neoplasm of lower-outer quadrant of right female breast: Secondary | ICD-10-CM | POA: Diagnosis not present

## 2023-12-26 DIAGNOSIS — Z171 Estrogen receptor negative status [ER-]: Secondary | ICD-10-CM | POA: Diagnosis not present

## 2023-12-26 DIAGNOSIS — Z510 Encounter for antineoplastic radiation therapy: Secondary | ICD-10-CM | POA: Diagnosis present

## 2023-12-27 ENCOUNTER — Telehealth: Payer: Self-pay | Admitting: *Deleted

## 2023-12-27 ENCOUNTER — Ambulatory Visit: Payer: Self-pay | Admitting: Student

## 2023-12-27 DIAGNOSIS — S62102A Fracture of unspecified carpal bone, left wrist, initial encounter for closed fracture: Secondary | ICD-10-CM

## 2023-12-27 NOTE — Telephone Encounter (Signed)
-----   Message from Jackqueline Mason sent at 12/27/2023 12:48 PM EDT ----- Please call patient to tell her Xray does show fractures, and I have referred her to ortho for further evaluation.  Melville Stade, DNP, AGNP-BC

## 2023-12-27 NOTE — Telephone Encounter (Unsigned)
 Copied from CRM [id number]. Topic: Clinical - Lab/Test Results >> Dec 26, 2023  4:37 PM Chuck Crater wrote: Reason for CRM: Patient wants to know the results of her scan. She does not know how to use Mychart.

## 2023-12-27 NOTE — Telephone Encounter (Signed)
 Pt returned your call about her results.

## 2023-12-27 NOTE — Telephone Encounter (Signed)
 Spoke with patient and advised that we are still waiting on results.  We will call her once we get back.

## 2023-12-27 NOTE — Telephone Encounter (Signed)
 FYI Called reading room and spoke with Gabe.  He stated that he will get xray read for us .    Tried calling patient back but no answer and vm not set up

## 2023-12-27 NOTE — Telephone Encounter (Signed)
 Per Camilo Cella patient can take Tylenol  500mg  q6 hours along with the Mobic .  Patient notified.

## 2023-12-27 NOTE — Telephone Encounter (Signed)
 Patient notified.  She would like to know if she could get something for pain?

## 2023-12-28 ENCOUNTER — Encounter: Payer: Self-pay | Admitting: *Deleted

## 2023-12-28 ENCOUNTER — Other Ambulatory Visit: Payer: Self-pay

## 2023-12-28 DIAGNOSIS — Z510 Encounter for antineoplastic radiation therapy: Secondary | ICD-10-CM | POA: Diagnosis not present

## 2023-12-28 DIAGNOSIS — Z171 Estrogen receptor negative status [ER-]: Secondary | ICD-10-CM | POA: Diagnosis not present

## 2023-12-28 DIAGNOSIS — C50511 Malignant neoplasm of lower-outer quadrant of right female breast: Secondary | ICD-10-CM

## 2023-12-28 LAB — RAD ONC ARIA SESSION SUMMARY
Course Elapsed Days: 0
Plan Fractions Treated to Date: 1
Plan Prescribed Dose Per Fraction: 2.66 Gy
Plan Total Fractions Prescribed: 16
Plan Total Prescribed Dose: 42.56 Gy
Reference Point Dosage Given to Date: 2.66 Gy
Reference Point Session Dosage Given: 2.66 Gy
Session Number: 1

## 2023-12-28 NOTE — Telephone Encounter (Signed)
 Results given to patient

## 2023-12-29 ENCOUNTER — Ambulatory Visit
Admission: RE | Admit: 2023-12-29 | Discharge: 2023-12-29 | Disposition: A | Discharge status: Home / Self Care | Source: Ambulatory Visit | Attending: Radiation Oncology | Admitting: Radiation Oncology

## 2023-12-29 ENCOUNTER — Other Ambulatory Visit: Payer: Self-pay

## 2023-12-29 ENCOUNTER — Ambulatory Visit (HOSPITAL_BASED_OUTPATIENT_CLINIC_OR_DEPARTMENT_OTHER): Admitting: Student

## 2023-12-29 DIAGNOSIS — Z510 Encounter for antineoplastic radiation therapy: Secondary | ICD-10-CM | POA: Diagnosis not present

## 2023-12-29 DIAGNOSIS — C50511 Malignant neoplasm of lower-outer quadrant of right female breast: Secondary | ICD-10-CM

## 2023-12-29 DIAGNOSIS — Z171 Estrogen receptor negative status [ER-]: Secondary | ICD-10-CM | POA: Diagnosis not present

## 2023-12-29 LAB — RAD ONC ARIA SESSION SUMMARY
Course Elapsed Days: 1
Plan Fractions Treated to Date: 2
Plan Prescribed Dose Per Fraction: 2.66 Gy
Plan Total Fractions Prescribed: 16
Plan Total Prescribed Dose: 42.56 Gy
Reference Point Dosage Given to Date: 5.32 Gy
Reference Point Session Dosage Given: 2.66 Gy
Session Number: 2

## 2023-12-29 MED ORDER — RADIAPLEXRX EX GEL: Freq: Once | CUTANEOUS | Status: AC

## 2023-12-29 MED ORDER — ALRA NON-METALLIC DEODORANT (RAD-ONC)
1.0000 | Freq: Once | TOPICAL | Status: AC
  Administered 2023-12-29: 1 via TOPICAL

## 2024-01-01 ENCOUNTER — Other Ambulatory Visit: Payer: Self-pay

## 2024-01-01 ENCOUNTER — Ambulatory Visit
Admission: RE | Admit: 2024-01-01 | Discharge: 2024-01-01 | Disposition: A | Discharge status: Home / Self Care | Source: Ambulatory Visit | Attending: Radiation Oncology

## 2024-01-01 DIAGNOSIS — C50511 Malignant neoplasm of lower-outer quadrant of right female breast: Secondary | ICD-10-CM | POA: Diagnosis not present

## 2024-01-01 DIAGNOSIS — Z510 Encounter for antineoplastic radiation therapy: Secondary | ICD-10-CM | POA: Diagnosis not present

## 2024-01-01 DIAGNOSIS — Z171 Estrogen receptor negative status [ER-]: Secondary | ICD-10-CM | POA: Diagnosis not present

## 2024-01-01 LAB — RAD ONC ARIA SESSION SUMMARY
Course Elapsed Days: 4
Plan Fractions Treated to Date: 3
Plan Prescribed Dose Per Fraction: 2.66 Gy
Plan Total Fractions Prescribed: 16
Plan Total Prescribed Dose: 42.56 Gy
Reference Point Dosage Given to Date: 7.98 Gy
Reference Point Session Dosage Given: 2.66 Gy
Session Number: 3

## 2024-01-02 ENCOUNTER — Ambulatory Visit
Admission: RE | Admit: 2024-01-02 | Discharge: 2024-01-02 | Disposition: A | Discharge status: Home / Self Care | Source: Ambulatory Visit | Attending: Radiation Oncology | Admitting: Radiation Oncology

## 2024-01-02 ENCOUNTER — Inpatient Hospital Stay (HOSPITAL_BASED_OUTPATIENT_CLINIC_OR_DEPARTMENT_OTHER): Attending: Hematology and Oncology | Admitting: Hematology and Oncology

## 2024-01-02 ENCOUNTER — Other Ambulatory Visit: Payer: Self-pay

## 2024-01-02 ENCOUNTER — Encounter: Payer: Self-pay | Admitting: Hematology and Oncology

## 2024-01-02 ENCOUNTER — Other Ambulatory Visit (HOSPITAL_COMMUNITY): Payer: Self-pay

## 2024-01-02 ENCOUNTER — Telehealth: Payer: Self-pay

## 2024-01-02 VITALS — BP 138/80 | HR 90 | Temp 98.2°F | Resp 18 | Ht 69.5 in | Wt 174.2 lb

## 2024-01-02 DIAGNOSIS — Z923 Personal history of irradiation: Secondary | ICD-10-CM | POA: Insufficient documentation

## 2024-01-02 DIAGNOSIS — M25532 Pain in left wrist: Secondary | ICD-10-CM | POA: Diagnosis not present

## 2024-01-02 DIAGNOSIS — C50511 Malignant neoplasm of lower-outer quadrant of right female breast: Secondary | ICD-10-CM | POA: Diagnosis not present

## 2024-01-02 DIAGNOSIS — M25432 Effusion, left wrist: Secondary | ICD-10-CM | POA: Diagnosis not present

## 2024-01-02 DIAGNOSIS — Z1732 Human epidermal growth factor receptor 2 negative status: Secondary | ICD-10-CM | POA: Insufficient documentation

## 2024-01-02 DIAGNOSIS — Z1722 Progesterone receptor negative status: Secondary | ICD-10-CM | POA: Insufficient documentation

## 2024-01-02 DIAGNOSIS — Z9221 Personal history of antineoplastic chemotherapy: Secondary | ICD-10-CM | POA: Insufficient documentation

## 2024-01-02 DIAGNOSIS — Z510 Encounter for antineoplastic radiation therapy: Secondary | ICD-10-CM | POA: Diagnosis not present

## 2024-01-02 DIAGNOSIS — Z171 Estrogen receptor negative status [ER-]: Secondary | ICD-10-CM | POA: Diagnosis not present

## 2024-01-02 LAB — RAD ONC ARIA SESSION SUMMARY
Course Elapsed Days: 5
Plan Fractions Treated to Date: 4
Plan Prescribed Dose Per Fraction: 2.66 Gy
Plan Total Fractions Prescribed: 16
Plan Total Prescribed Dose: 42.56 Gy
Reference Point Dosage Given to Date: 10.64 Gy
Reference Point Session Dosage Given: 2.66 Gy
Session Number: 4

## 2024-01-02 MED ORDER — MELOXICAM 7.5 MG PO TABS: 7.5000 mg | ORAL_TABLET | Freq: Every day | ORAL | 0 refills | Status: AC

## 2024-01-02 MED ORDER — METOPROLOL TARTRATE 25 MG PO TABS: 25.0000 mg | ORAL_TABLET | Freq: Two times a day (BID) | ORAL | 3 refills | Status: AC

## 2024-01-02 MED ORDER — CAPECITABINE 500 MG PO TABS
1000.0000 mg | ORAL_TABLET | Freq: Two times a day (BID) | ORAL | 7 refills | Status: DC
  Filled 2024-01-09: qty 56, 14d supply, fill #0
  Filled 2024-01-29: qty 56, 21d supply, fill #1

## 2024-01-02 MED ORDER — GABAPENTIN 100 MG PO CAPS: 100.0000 mg | ORAL_CAPSULE | Freq: Three times a day (TID) | ORAL | 0 refills | Status: DC

## 2024-01-02 MED ORDER — OXYCODONE HCL 5 MG PO TABS: 5.0000 mg | ORAL_TABLET | Freq: Four times a day (QID) | ORAL | 0 refills | Status: DC | PRN

## 2024-01-02 NOTE — Progress Notes (Signed)
 Patient Care Team: Jackqueline Mason, NP as PCP - General (Nurse Practitioner) Cameron Cea, MD as Consulting Physician (Hematology and Oncology) Auther Bo, RN as Oncology Nurse Navigator Alane Hsu, RN as Oncology Nurse Navigator Caralyn Chandler, MD as Consulting Physician (General Surgery)  DIAGNOSIS:  Encounter Diagnosis  Name Primary?   Malignant neoplasm of lower-outer quadrant of right breast of female, estrogen receptor negative (HCC) Yes    SUMMARY OF ONCOLOGIC HISTORY: Oncology History  Malignant neoplasm of lower-outer quadrant of right breast of female, estrogen receptor negative (HCC)  02/22/2023 Initial Diagnosis   [facility]: Palpable mass, mammogram detected right breast mass 5 cm from the nipple 1.6 cm, separate oval circumscribed hypoechoic mass 7 mm (?  Intramammary lymph node: Benign) grouped amorphous calcifications UOQ left breast 7 mm (benign), right axillary lymph node biopsy negative Biopsy: Poorly differentiated IDC ER less than 1%, PR less than 1%, HER2 0, Ki-67 90%, skin satellite nodule biopsy positive   04/28/2023 -  Neo-Adjuvant Chemotherapy   Neoadjuvant chemotherapy with Taxol carboplatin (every 3 weeks x 4) Keytruda  followed by Adriamycin  Cytoxan  Keytruda  (Dr. Fritz Jewel at [facility]): AC-K cycle 1 given on 08/01/2022 at [facility] (echo 04/25/2023: EF 55 to 60%, strain -18.5%)   08/04/2023 Cancer Staging   Staging form: Breast, AJCC 8th Edition - Clinical: Stage IIIC (cT4b, cN0, cM0, G3, ER-, PR-, HER2-) - Signed by Cameron Cea, MD on 08/04/2023 Stage prefix: Initial diagnosis Histologic grading system: 3 grade system   08/04/2023 -  Chemotherapy   Patient is on Treatment Plan : BREAST Pembrolizumab  (200) D1 + Carboplatin (5) D1 + Paclitaxel (80) D1,8,15 q21d X 4 cycles / Pembrolizumab  (200) D1 + AC D1 q21d x 4 cycles       CHIEF COMPLIANT:   HISTORY OF PRESENT ILLNESS:  History of Present Illness Erika Lopez is a 58 year old  female with breast cancer who presents with a fractured arm and ongoing cancer treatment.  She sustained a fracture of the distal right radius after a fall, confirmed by x-ray on June 4th, and experiences significant pain. She has completed radiation therapy and previously received Keytruda  for breast cancer. She is considering starting Xeloda, a chemotherapy pill, and is concerned about its cost and transportation for appointments. Her current medications include oxycodone  for pain and Mobic . She is worried about the potential cardiac side effects of her treatments. She has issues with taste and difficulty eating. She has a history of chemotherapy without experiencing diarrhea or vomiting, which she finds manageable compared to past treatments.     ALLERGIES:  is allergic to wound dressing adhesive.  MEDICATIONS:  Current Outpatient Medications  Medication Sig Dispense Refill   diphenhydrAMINE (BENADRYL) 50 MG capsule Take 50 mg by mouth every 6 (six) hours as needed for allergies or itching.     gabapentin  (NEURONTIN ) 100 MG capsule Take 1 capsule (100 mg total) by mouth 3 (three) times daily. (Patient not taking: Reported on 01/02/2024) 90 capsule 0   meloxicam  (MOBIC ) 7.5 MG tablet Take 1 tablet (7.5 mg total) by mouth daily. (Patient not taking: Reported on 01/02/2024) 14 tablet 0   metoprolol  tartrate (LOPRESSOR ) 25 MG tablet Take 1 tablet (25 mg total) by mouth 2 (two) times daily. (Patient not taking: Reported on 01/02/2024) 60 tablet 3   oxyCODONE  (ROXICODONE ) 5 MG immediate release tablet Take 1 tablet (5 mg total) by mouth every 6 (six) hours as needed for severe pain (pain score 7-10). (Patient not  taking: Reported on 01/02/2024) 10 tablet 0   No current facility-administered medications for this visit.    PHYSICAL EXAMINATION: ECOG PERFORMANCE STATUS: 1 - Symptomatic but completely ambulatory  Vitals:   01/02/24 1031  BP: 138/80  Pulse: 90  Resp: 18  Temp: 98.2 F (36.8 C)   SpO2: 100%   Filed Weights   01/02/24 1031  Weight: 174 lb 3.2 oz (79 kg)    Physical Exam   (exam performed in the presence of a chaperone)  LABORATORY DATA:  I have reviewed the data as listed    Latest Ref Rng & Units 11/03/2023   10:40 AM 10/05/2023   12:50 PM 09/15/2023    7:58 AM  CMP  Glucose 70 - 99 mg/dL 76  098  119   BUN 6 - 20 mg/dL 7  6  7    Creatinine 0.44 - 1.00 mg/dL 1.47  8.29  5.62   Sodium 135 - 145 mmol/L 140  138  139   Potassium 3.5 - 5.1 mmol/L 4.1  3.8  3.6   Chloride 98 - 111 mmol/L 105  103  104   CO2 22 - 32 mmol/L 26  30  31    Calcium 8.9 - 10.3 mg/dL 9.3  9.0  8.9   Total Protein 6.5 - 8.1 g/dL  7.4  6.5   Total Bilirubin 0.0 - 1.2 mg/dL  0.3  0.3   Alkaline Phos 38 - 126 U/L  83  75   AST 15 - 41 U/L  22  12   ALT 0 - 44 U/L  23  19     Lab Results  Component Value Date   WBC 5.7 11/03/2023   HGB 10.7 (L) 11/03/2023   HCT 33.2 (L) 11/03/2023   MCV 88.5 11/03/2023   PLT 324 11/03/2023   NEUTROABS 5.3 10/05/2023    ASSESSMENT & PLAN:  Malignant neoplasm of lower-outer quadrant of right breast of female, estrogen receptor negative (HCC) 02/22/2023: [facility]: Palpable mass, mammogram detected right breast mass 5 cm from the nipple 1.6 cm, separate oval circumscribed hypoechoic mass 7 mm (?  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%, skin satellite nodule biopsy positive   Treatment plan: Neoadjuvant chemotherapy with Taxol carbo Keytruda  every 3 weeks x 4 (completed at [facility]), received first cycle of Adriamycin  Cytoxan  Keytruda  on 08/01/2022: Plan to complete keynote 522 regimen with cycle 2 Adriamycin  Cytoxan  Keytruda  to be given 08/23/2023, followed by Keytruda  maintenance (patient had serious adverse effects and discontinued Keytruda ) 11/07/2023:  Right lumpectomy: Grade 3 IDC 1.8 cm, margins negative, negative for lymphovascular or perineural invasion, 0/3 lymph nodes negative Adjuvant radiation therapy started 12/29/2023 -------------------------------------------------------------------------------------------------------------------- Chemotherapy-induced anemia Tachycardia: Prescribed metoprolol  25 mg p.o. twice daily Financial distress  Fracture of the left arm: From the recent fall.  I encouraged her to go to [facility] urgent care and get it evaluated. We are discontinuing Keytruda  maintenance.  I will request for the port removal. I recommended capecitabine adjuvant therapy and sent a prescription for Xeloda 1000 mg p.o. twice daily 14 days on 7 days off Next week we will make an appointment for her to see Autry Legions our pharmacist for education.  All of her appointments have to coincide with her transportation for radiation. ------------------------------------- Assessment and Plan Assessment & Plan Malignant neoplasm of lower-outer quadrant  of right breast Estrogen receptor negative breast cancer. Completed chemotherapy, undergoing radiation. Discussed Keytruda 's cardiac risks. Proposed Xeloda based on Createx trial showing 7% improvement in cancer-free survival. - Discontinue Keytruda . - Start Xeloda, two tablets in the morning and two in the evening for 14 days, followed by 7 days off, for 6 months. - Arrange port removal by radiologist. - Coordinate with pharmacist for medication education and obtain Xeloda at no cost. - Schedule pharmacist meeting during radiation for Xeloda education.  Fracture of distal right radius Minimally displaced fracture with significant pain.  - Provide urgent care address for casting. - Arrange transportation to urgent care.      No orders of the defined types were placed in this encounter.  The patient has a good understanding of the overall plan. she agrees with it. she will call with any  problems that may develop before the next visit here. Total time spent: 30 mins including face to face time and time spent for planning, charting and co-ordination of care   Viinay K Corbitt Cloke, MD 01/02/24

## 2024-01-02 NOTE — Telephone Encounter (Signed)
 Oral Oncology Patient Advocate Encounter  After completing a benefits investigation, prior authorization for Capecitabine is not required at this time through [facility] ([facility]).  Patient's copay is $0.00.     Hansel Ley, CPhT Supervisor Pharmacy Patient Advocate [facility] [phone] (Ph) 01/02/2024 11:02 AM

## 2024-01-02 NOTE — Assessment & Plan Note (Signed)
 02/22/2023: [facility]: Palpable mass, mammogram detected right breast mass 5 cm from the nipple 1.6 cm, separate oval circumscribed hypoechoic mass 7 mm (?  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%, skin satellite nodule biopsy positive   Treatment plan: Neoadjuvant chemotherapy with Taxol carbo Keytruda  every 3 weeks x 4 (completed at [facility]), received first cycle of Adriamycin  Cytoxan  Keytruda  on 08/01/2022: Plan to complete keynote 522 regimen with cycle 2 Adriamycin  Cytoxan  Keytruda  to be given 08/23/2023, followed by Keytruda  maintenance 11/07/2023: Right lumpectomy: Grade 3 IDC 1.8 cm, margins negative, negative for lymphovascular or perineural invasion, 0/3 lymph nodes negative Adjuvant radiation therapy -------------------------------------------------------------------------------------------------------------------- Chemotherapy-induced anemia Tachycardia: Prescribed metoprolol  25 mg p.o. twice daily Financial distress  I recommended that she continue immunotherapy maintenance.

## 2024-01-03 ENCOUNTER — Ambulatory Visit
Admission: RE | Admit: 2024-01-03 | Discharge: 2024-01-03 | Disposition: A | Discharge status: Home / Self Care | Source: Ambulatory Visit | Attending: Radiation Oncology | Admitting: Radiation Oncology

## 2024-01-03 ENCOUNTER — Other Ambulatory Visit: Payer: Self-pay

## 2024-01-03 ENCOUNTER — Telehealth: Payer: Self-pay | Admitting: *Deleted

## 2024-01-03 DIAGNOSIS — C50511 Malignant neoplasm of lower-outer quadrant of right female breast: Secondary | ICD-10-CM | POA: Diagnosis not present

## 2024-01-03 DIAGNOSIS — Z171 Estrogen receptor negative status [ER-]: Secondary | ICD-10-CM | POA: Diagnosis not present

## 2024-01-03 DIAGNOSIS — Z510 Encounter for antineoplastic radiation therapy: Secondary | ICD-10-CM | POA: Diagnosis not present

## 2024-01-03 LAB — RAD ONC ARIA SESSION SUMMARY
Course Elapsed Days: 6
Plan Fractions Treated to Date: 5
Plan Prescribed Dose Per Fraction: 2.66 Gy
Plan Total Fractions Prescribed: 16
Plan Total Prescribed Dose: 42.56 Gy
Reference Point Dosage Given to Date: 13.3 Gy
Reference Point Session Dosage Given: 2.66 Gy
Session Number: 5

## 2024-01-03 NOTE — Telephone Encounter (Addendum)
 No answer/ no vm.  Saw where pt not showed for ortho appt.  Wanted to check status?

## 2024-01-04 ENCOUNTER — Other Ambulatory Visit: Payer: Self-pay

## 2024-01-04 ENCOUNTER — Ambulatory Visit
Admission: RE | Admit: 2024-01-04 | Discharge: 2024-01-04 | Disposition: A | Discharge status: Home / Self Care | Source: Ambulatory Visit | Attending: Radiation Oncology | Admitting: Radiation Oncology

## 2024-01-04 ENCOUNTER — Other Ambulatory Visit (HOSPITAL_COMMUNITY): Payer: Self-pay

## 2024-01-04 DIAGNOSIS — C50511 Malignant neoplasm of lower-outer quadrant of right female breast: Secondary | ICD-10-CM | POA: Diagnosis not present

## 2024-01-04 DIAGNOSIS — Z510 Encounter for antineoplastic radiation therapy: Secondary | ICD-10-CM | POA: Diagnosis not present

## 2024-01-04 DIAGNOSIS — Z171 Estrogen receptor negative status [ER-]: Secondary | ICD-10-CM | POA: Diagnosis not present

## 2024-01-04 LAB — RAD ONC ARIA SESSION SUMMARY
Course Elapsed Days: 7
Plan Fractions Treated to Date: 6
Plan Prescribed Dose Per Fraction: 2.66 Gy
Plan Total Fractions Prescribed: 16
Plan Total Prescribed Dose: 42.56 Gy
Reference Point Dosage Given to Date: 15.96 Gy
Reference Point Session Dosage Given: 2.66 Gy
Session Number: 6

## 2024-01-04 NOTE — Telephone Encounter (Signed)
 No answer no vm set up

## 2024-01-04 NOTE — Telephone Encounter (Signed)
 Call 1 - Attempted to contact pt in regard to setting up her medication. She has an apt with Autry Legions 6/18. She may want to p/u after. - No answer, no vm set up. Sent mychart msg.

## 2024-01-04 NOTE — Telephone Encounter (Signed)
 Patient stated that she went [facility] urgent care.  They put her in a splint and she has a follow up with them.

## 2024-01-05 ENCOUNTER — Other Ambulatory Visit: Payer: Self-pay

## 2024-01-05 ENCOUNTER — Ambulatory Visit
Admission: RE | Admit: 2024-01-05 | Discharge: 2024-01-05 | Disposition: A | Discharge status: Home / Self Care | Source: Ambulatory Visit | Attending: Radiation Oncology

## 2024-01-05 ENCOUNTER — Ambulatory Visit
Admission: RE | Admit: 2024-01-05 | Discharge: 2024-01-05 | Disposition: A | Discharge status: Home / Self Care | Source: Ambulatory Visit | Attending: Radiation Oncology | Admitting: Radiation Oncology

## 2024-01-05 DIAGNOSIS — Z171 Estrogen receptor negative status [ER-]: Secondary | ICD-10-CM | POA: Diagnosis not present

## 2024-01-05 DIAGNOSIS — C50511 Malignant neoplasm of lower-outer quadrant of right female breast: Secondary | ICD-10-CM | POA: Diagnosis not present

## 2024-01-05 DIAGNOSIS — Z510 Encounter for antineoplastic radiation therapy: Secondary | ICD-10-CM | POA: Diagnosis not present

## 2024-01-05 LAB — RAD ONC ARIA SESSION SUMMARY
Course Elapsed Days: 8
Plan Fractions Treated to Date: 7
Plan Prescribed Dose Per Fraction: 2.66 Gy
Plan Total Fractions Prescribed: 16
Plan Total Prescribed Dose: 42.56 Gy
Reference Point Dosage Given to Date: 18.62 Gy
Reference Point Session Dosage Given: 2.66 Gy
Session Number: 7

## 2024-01-08 ENCOUNTER — Ambulatory Visit
Admission: RE | Admit: 2024-01-08 | Discharge: 2024-01-08 | Disposition: A | Discharge status: Home / Self Care | Source: Ambulatory Visit | Attending: Radiation Oncology

## 2024-01-08 ENCOUNTER — Other Ambulatory Visit (HOSPITAL_COMMUNITY): Payer: Self-pay

## 2024-01-08 ENCOUNTER — Other Ambulatory Visit: Payer: Self-pay

## 2024-01-08 DIAGNOSIS — Z510 Encounter for antineoplastic radiation therapy: Secondary | ICD-10-CM | POA: Diagnosis not present

## 2024-01-08 DIAGNOSIS — Z171 Estrogen receptor negative status [ER-]: Secondary | ICD-10-CM | POA: Diagnosis not present

## 2024-01-08 DIAGNOSIS — C50511 Malignant neoplasm of lower-outer quadrant of right female breast: Secondary | ICD-10-CM | POA: Diagnosis not present

## 2024-01-08 LAB — RAD ONC ARIA SESSION SUMMARY
Course Elapsed Days: 11
Plan Fractions Treated to Date: 8
Plan Prescribed Dose Per Fraction: 2.66 Gy
Plan Total Fractions Prescribed: 16
Plan Total Prescribed Dose: 42.56 Gy
Reference Point Dosage Given to Date: 21.28 Gy
Reference Point Session Dosage Given: 2.66 Gy
Session Number: 8

## 2024-01-08 NOTE — Telephone Encounter (Signed)
 Sent MyChart Message last week also.  Call 2 01/08/24- Still no answer, VM not set up. Called pt's emergency contact, mother. Went straight to VM. Was able to leave a VM for her to have the pt call me @ [phone]  Autry Legions, if [place] or I aren't able to speak with her prior to your appointment with her, would you please have her call us  while she's there? The medication will not be at the pharmacy for her to pick up after nor will it be sent out for her to start the next day, otherwise.

## 2024-01-09 ENCOUNTER — Telehealth: Payer: Self-pay | Admitting: Pharmacy Technician

## 2024-01-09 ENCOUNTER — Other Ambulatory Visit: Payer: Self-pay

## 2024-01-09 ENCOUNTER — Ambulatory Visit
Admission: RE | Admit: 2024-01-09 | Discharge: 2024-01-09 | Disposition: A | Discharge status: Home / Self Care | Source: Ambulatory Visit | Attending: Radiation Oncology | Admitting: Radiation Oncology

## 2024-01-09 ENCOUNTER — Other Ambulatory Visit: Payer: Self-pay | Admitting: Pharmacy Technician

## 2024-01-09 DIAGNOSIS — Z510 Encounter for antineoplastic radiation therapy: Secondary | ICD-10-CM | POA: Diagnosis not present

## 2024-01-09 DIAGNOSIS — C50511 Malignant neoplasm of lower-outer quadrant of right female breast: Secondary | ICD-10-CM | POA: Diagnosis not present

## 2024-01-09 DIAGNOSIS — Z171 Estrogen receptor negative status [ER-]: Secondary | ICD-10-CM | POA: Diagnosis not present

## 2024-01-09 LAB — RAD ONC ARIA SESSION SUMMARY
Course Elapsed Days: 12
Plan Fractions Treated to Date: 9
Plan Prescribed Dose Per Fraction: 2.66 Gy
Plan Total Fractions Prescribed: 16
Plan Total Prescribed Dose: 42.56 Gy
Reference Point Dosage Given to Date: 23.94 Gy
Reference Point Session Dosage Given: 2.66 Gy
Session Number: 9

## 2024-01-09 NOTE — Telephone Encounter (Signed)
 Oral Oncology Patient Advocate Encounter  When I spoke with this pt about getting her oral chemo set up she asked when her procedure was tomorrow. I did not see it on her apt calendar. She said she thought she was getting her port out tomorrow. I transferred her to Dr. Alix Aquas nurse's voice mail. She has been hard to get in touch with for us . She said to please call her back today if you can.  Roda Cirri, CPhT Specialty Pharmacy Patient Advocate Phone: [phone] Fax: [phone]

## 2024-01-09 NOTE — Progress Notes (Signed)
 Specialty Pharmacy Initial Fill Coordination Note  Erika Lopez is a 58 y.o. female contacted today regarding refills of specialty medication(s) Capecitabine  (XELODA ) .  Patient requested Delivery  on 01/11/24  to verified address [address] [place] [address]   Medication will be filled on 01/10/24.   Patient is aware of $0 copayment.   Sent my chart message, but then she said she wasn't able to get into it. Please mail her welcome pack.

## 2024-01-09 NOTE — Progress Notes (Signed)
 [facility]       Telephone: [phone]?Fax: [phone]   Oncology Clinical Pharmacist Practitioner Initial Assessment  Erika Lopez is a 58 y.o. female with a diagnosis of breast cancer. They were contacted today via in-person visit.  Indication/Regimen Capecitabine  (Xeloda ) is being used appropriately for treatment of breast cancer in the adjuvant setting by Dr. Vinay Gudena.      Wt Readings from Last 1 Encounters:  01/10/24 172 lb 14.4 oz (78.4 kg)    Estimated body surface area is 1.96 meters squared as calculated from the following:   Height as of 01/02/24: 5' 9.5 (1.765 m).   Weight as of this encounter: 172 lb 14.4 oz (78.4 kg).  The dosing regimen is 2 tablets (1000 mg) by mouth in the morning and 2 tablets (1000 mg) in the evening on days 1 to 14 of a 21-day cycle. . This is being given  monotherapy. It is planned to continue until six months per the Create-X trial data. Prescription dose and frequency assessed for appropriateness. Dr. Gudena has discontinued pembrolizumab  due to side effects.  Patient has agreed to treatment which is documented in physician note on 01/02/24. Counseled patient on administration, dosing, side effects, monitoring, drug-food interactions, safe handling, storage, and disposal.  Will have capecitabine  delivered on 01/11/24 and will start next day (01/12/24). Follow up in 3 weeks prior to cycle 2 with Dr. Gudena.  Dose Modifications Dr. Gudena is giving ~508 mg/m2 BID 14 out of 21 days. May increase as tolerated.  Access Assessment [name] will be receiving capecitabine  through [facility] Concerns: none Start date if known: 01/12/24  Adherence Assessment Reviewed importance on keeping a med schedule and plan for any missed doses Barriers to adherence identified? No  Allergies Allergies  Allergen Reactions   Wound Dressing Adhesive Rash    Vitals    01/10/2024   11:17 AM 01/02/2024    10:31 AM 12/19/2023    1:26 PM  Oncology Vitals  Height  177 cm 177 cm  Weight 78.427 kg 79.017 kg 79.289 kg  Weight (lbs) 172 lbs 14 oz 174 lbs 3 oz 174 lbs 13 oz  BMI 25.17 kg/m2 25.36 kg/m2 25.44 kg/m2  Temp 98.1 F (36.7 C) 98.2 F (36.8 C) 98.7 F (37.1 C)  Pulse Rate 84 90 95  BP 130/72 138/80 132/80  Resp 18 18 12   SpO2 100 % 100 % 99 %  BSA (m2) 1.96 m2 1.97 m2 1.97 m2     Laboratory Data    Latest Ref Rng & Units 01/10/2024   10:51 AM 11/03/2023   10:40 AM 10/05/2023   12:50 PM  CBC EXTENDED  WBC 4.0 - 10.5 K/uL 4.5  5.7  8.0   RBC 3.87 - 5.11 MIL/uL 4.85  3.75  3.57   Hemoglobin 12.0 - 15.0 g/dL 29.5  62.1  30.8   HCT 36.0 - 46.0 % 39.3  33.2  30.0   Platelets 150 - 400 K/uL 217  324  378   NEUT# 1.7 - 7.7 K/uL 2.8   5.3   Lymph# 0.7 - 4.0 K/uL 1.3   1.4        Latest Ref Rng & Units 01/10/2024   10:51 AM 11/03/2023   10:40 AM 10/05/2023   12:50 PM  CMP  Glucose 70 - 99 mg/dL 89  76  657   BUN 6 - 20 mg/dL 6  7  6    Creatinine 0.44 -  1.00 mg/dL 1.61  0.96  0.45   Sodium 135 - 145 mmol/L 141  140  138   Potassium 3.5 - 5.1 mmol/L 3.9  4.1  3.8   Chloride 98 - 111 mmol/L 106  105  103   CO2 22 - 32 mmol/L 28  26  30    Calcium 8.9 - 10.3 mg/dL 9.1  9.3  9.0   Total Protein 6.5 - 8.1 g/dL 7.3   7.4   Total Bilirubin 0.0 - 1.2 mg/dL 0.5   0.3   Alkaline Phos 38 - 126 U/L 113   83   AST 15 - 41 U/L 17   22   ALT 0 - 44 U/L 13   23    Contraindications Contraindications were reviewed? Yes Contraindications to therapy were identified? No   Safety Precautions The following safety precautions for the use of capecitabine  were reviewed:  Fever: reviewed the importance of having a thermometer and the Centers for Disease Control and Prevention (CDC) definition of fever which is 100.63F (38C) or higher. Patient should call 24/7 triage at [phone] if experiencing a fever or any other symptoms Decreased white blood cells (WBCs) and increased risk for  infection Decreased hemoglobin, part of the red blood cells that carry iron and oxygen Diarrhea Mouth irritation or sores Pain or discomfort in hands and/or feet Changes in liver function Nausea or vomiting Fatigue Abdominal pain Decreased platelet count and increased risk of bleeding Interactions with warfarin Cardiotoxicity Kidney injury Dehydration Alopecia Handling body fluids and waste Pregnancy, sexual activity, and contraception Storage and Handling To be taken within 30 minutes of a meal Missed doses  Medication Reconciliation Current Outpatient Medications  Medication Sig Dispense Refill   diphenhydrAMINE (BENADRYL) 50 MG capsule Take 50 mg by mouth every 6 (six) hours as needed for allergies or itching.     gabapentin  (NEURONTIN ) 100 MG capsule Take 1 capsule (100 mg total) by mouth 3 (three) times daily. 90 capsule 0   meloxicam  (MOBIC ) 7.5 MG tablet Take 1 tablet (7.5 mg total) by mouth daily. 14 tablet 0   metoprolol  tartrate (LOPRESSOR ) 25 MG tablet Take 1 tablet (25 mg total) by mouth 2 (two) times daily. 60 tablet 3   oxyCODONE  (ROXICODONE ) 5 MG immediate release tablet Take 1 tablet (5 mg total) by mouth every 6 (six) hours as needed for severe pain (pain score 7-10). 20 tablet 0   capecitabine  (XELODA ) 500 MG tablet Take 2 tablets (1,000 mg total) by mouth 2 (two) times daily after a meal. 14 days on and 7 days off (Patient not taking: Reported on 01/10/2024) 56 tablet 7   No current facility-administered medications for this visit.   Medication reconciliation is based on the patient's most recent medication list in the electronic medical record (EMR) including herbal products and OTC medications.   The patient's medication list was reviewed today with the patient? Yes   Drug-drug interactions (DDIs) DDIs were evaluated? Yes Significant DDIs identified? No   Drug-Food Interactions Drug-food interactions were evaluated? Yes Drug-food interactions identified?  No   Follow-up Plan  Patient education handout given to patient Start 2 tablets (1000 mg) by mouth every 12 hours 14 days on, followed by a 7-day rest period. Start 01/12/24. Second cycle 02/02/24 See Dr. Lee Public with labs on 02/01/24. Monitor for side effects every 3 weeks or as clinically indicated. Monitor for toxicities Dulcemaria Flenner can follow up with clinical pharmacy as deemed necessary by Dr. Vinay Gudena going forward  Ladawn Buescher participated in the discussion, expressed understanding, and voiced agreement with the above plan. All questions were answered to her satisfaction. The patient was advised to contact the clinic at [phone] with any questions or concerns prior to her return visit.   I spent 60 minutes assessing the patient.  Breion Novacek A. Webb Hake, PharmD, BCOP, CPP  Althea Atkinson, RPH-CPP, 01/10/2024 11:45 AM  **Disclaimer: This note was dictated with voice recognition software. Similar sounding words can inadvertently be transcribed and this note may contain transcription errors which may not have been corrected upon publication of note.**

## 2024-01-10 ENCOUNTER — Encounter: Payer: Self-pay | Admitting: Hematology and Oncology

## 2024-01-10 ENCOUNTER — Inpatient Hospital Stay: Admitting: Pharmacist

## 2024-01-10 ENCOUNTER — Inpatient Hospital Stay

## 2024-01-10 ENCOUNTER — Other Ambulatory Visit: Payer: Self-pay

## 2024-01-10 ENCOUNTER — Telehealth: Payer: Self-pay | Admitting: *Deleted

## 2024-01-10 ENCOUNTER — Ambulatory Visit
Admission: RE | Admit: 2024-01-10 | Discharge: 2024-01-10 | Disposition: A | Discharge status: Home / Self Care | Source: Ambulatory Visit | Attending: Radiation Oncology | Admitting: Radiation Oncology

## 2024-01-10 VITALS — BP 130/72 | HR 84 | Temp 98.1°F | Resp 18 | Wt 172.9 lb

## 2024-01-10 DIAGNOSIS — C50511 Malignant neoplasm of lower-outer quadrant of right female breast: Secondary | ICD-10-CM | POA: Diagnosis not present

## 2024-01-10 DIAGNOSIS — Z510 Encounter for antineoplastic radiation therapy: Secondary | ICD-10-CM | POA: Diagnosis not present

## 2024-01-10 DIAGNOSIS — Z171 Estrogen receptor negative status [ER-]: Secondary | ICD-10-CM

## 2024-01-10 LAB — CBC WITH DIFFERENTIAL (CANCER CENTER ONLY)
Abs Immature Granulocytes: 0 10*3/uL (ref 0.00–0.07)
Basophils Absolute: 0 10*3/uL (ref 0.0–0.1)
Basophils Relative: 1 %
Eosinophils Absolute: 0.1 10*3/uL (ref 0.0–0.5)
Eosinophils Relative: 1 %
HCT: 39.3 % (ref 36.0–46.0)
Hemoglobin: 13.2 g/dL (ref 12.0–15.0)
Immature Granulocytes: 0 %
Lymphocytes Relative: 28 %
Lymphs Abs: 1.3 10*3/uL (ref 0.7–4.0)
MCH: 27.2 pg (ref 26.0–34.0)
MCHC: 33.6 g/dL (ref 30.0–36.0)
MCV: 81 fL (ref 80.0–100.0)
Monocytes Absolute: 0.4 10*3/uL (ref 0.1–1.0)
Monocytes Relative: 9 %
Neutro Abs: 2.8 10*3/uL (ref 1.7–7.7)
Neutrophils Relative %: 61 %
Platelet Count: 217 10*3/uL (ref 150–400)
RBC: 4.85 MIL/uL (ref 3.87–5.11)
RDW: 13.5 % (ref 11.5–15.5)
WBC Count: 4.5 10*3/uL (ref 4.0–10.5)
nRBC: 0 % (ref 0.0–0.2)

## 2024-01-10 LAB — CMP (CANCER CENTER ONLY)
ALT: 13 U/L (ref 0–44)
AST: 17 U/L (ref 15–41)
Albumin: 4.3 g/dL (ref 3.5–5.0)
Alkaline Phosphatase: 113 U/L (ref 38–126)
Anion gap: 7 (ref 5–15)
BUN: 6 mg/dL (ref 6–20)
CO2: 28 mmol/L (ref 22–32)
Calcium: 9.1 mg/dL (ref 8.9–10.3)
Chloride: 106 mmol/L (ref 98–111)
Creatinine: 0.58 mg/dL (ref 0.44–1.00)
GFR, Estimated: >60 mL/min (ref >60)
Glucose, Bld: 89 mg/dL (ref 70–99)
Potassium: 3.9 mmol/L (ref 3.5–5.1)
Sodium: 141 mmol/L (ref 135–145)
Total Bilirubin: 0.5 mg/dL (ref 0.0–1.2)
Total Protein: 7.3 g/dL (ref 6.5–8.1)

## 2024-01-10 LAB — RAD ONC ARIA SESSION SUMMARY
Course Elapsed Days: 13
Plan Fractions Treated to Date: 10
Plan Prescribed Dose Per Fraction: 2.66 Gy
Plan Total Fractions Prescribed: 16
Plan Total Prescribed Dose: 42.56 Gy
Reference Point Dosage Given to Date: 26.6 Gy
Reference Point Session Dosage Given: 2.66 Gy
Session Number: 10

## 2024-01-10 NOTE — Telephone Encounter (Signed)
 Received in basket from oral pharmacy team stating pt was wanting to know when her surgery would be on 01/10/24.  RN does not see any surgery appts scheduled but does see radiation and appt with our office. RN attempt x1 to contact pt.  No answer, unable to LVM due to VM not being set up.

## 2024-01-10 NOTE — Progress Notes (Signed)
 Patient counseled in clinic in-person visit note on 01/10/24

## 2024-01-11 ENCOUNTER — Ambulatory Visit

## 2024-01-12 ENCOUNTER — Ambulatory Visit: Admitting: Radiation Oncology

## 2024-01-12 ENCOUNTER — Ambulatory Visit
Admission: RE | Admit: 2024-01-12 | Discharge: 2024-01-12 | Disposition: A | Discharge status: Home / Self Care | Source: Ambulatory Visit | Attending: Radiation Oncology | Admitting: Radiation Oncology

## 2024-01-12 ENCOUNTER — Other Ambulatory Visit: Payer: Self-pay

## 2024-01-12 DIAGNOSIS — Z510 Encounter for antineoplastic radiation therapy: Secondary | ICD-10-CM | POA: Diagnosis not present

## 2024-01-12 DIAGNOSIS — Z171 Estrogen receptor negative status [ER-]: Secondary | ICD-10-CM | POA: Diagnosis not present

## 2024-01-12 DIAGNOSIS — S52552A Other extraarticular fracture of lower end of left radius, initial encounter for closed fracture: Secondary | ICD-10-CM | POA: Diagnosis not present

## 2024-01-12 DIAGNOSIS — C50511 Malignant neoplasm of lower-outer quadrant of right female breast: Secondary | ICD-10-CM | POA: Diagnosis not present

## 2024-01-12 LAB — RAD ONC ARIA SESSION SUMMARY
Course Elapsed Days: 15
Plan Fractions Treated to Date: 11
Plan Prescribed Dose Per Fraction: 2.66 Gy
Plan Total Fractions Prescribed: 16
Plan Total Prescribed Dose: 42.56 Gy
Reference Point Dosage Given to Date: 29.26 Gy
Reference Point Session Dosage Given: 2.66 Gy
Session Number: 11

## 2024-01-15 ENCOUNTER — Other Ambulatory Visit: Payer: Self-pay

## 2024-01-15 ENCOUNTER — Ambulatory Visit
Admission: RE | Admit: 2024-01-15 | Discharge: 2024-01-15 | Disposition: A | Discharge status: Home / Self Care | Source: Ambulatory Visit | Attending: Radiation Oncology

## 2024-01-15 DIAGNOSIS — Z510 Encounter for antineoplastic radiation therapy: Secondary | ICD-10-CM | POA: Diagnosis not present

## 2024-01-15 DIAGNOSIS — C50511 Malignant neoplasm of lower-outer quadrant of right female breast: Secondary | ICD-10-CM | POA: Diagnosis not present

## 2024-01-15 DIAGNOSIS — Z171 Estrogen receptor negative status [ER-]: Secondary | ICD-10-CM | POA: Diagnosis not present

## 2024-01-15 LAB — RAD ONC ARIA SESSION SUMMARY
Course Elapsed Days: 18
Plan Fractions Treated to Date: 12
Plan Prescribed Dose Per Fraction: 2.66 Gy
Plan Total Fractions Prescribed: 16
Plan Total Prescribed Dose: 42.56 Gy
Reference Point Dosage Given to Date: 31.92 Gy
Reference Point Session Dosage Given: 2.66 Gy
Session Number: 12

## 2024-01-16 ENCOUNTER — Other Ambulatory Visit: Payer: Self-pay

## 2024-01-16 ENCOUNTER — Ambulatory Visit
Admission: RE | Admit: 2024-01-16 | Discharge: 2024-01-16 | Disposition: A | Discharge status: Home / Self Care | Source: Ambulatory Visit | Attending: Radiation Oncology | Admitting: Radiation Oncology

## 2024-01-16 DIAGNOSIS — C50511 Malignant neoplasm of lower-outer quadrant of right female breast: Secondary | ICD-10-CM | POA: Diagnosis not present

## 2024-01-16 DIAGNOSIS — Z510 Encounter for antineoplastic radiation therapy: Secondary | ICD-10-CM | POA: Diagnosis not present

## 2024-01-16 DIAGNOSIS — Z171 Estrogen receptor negative status [ER-]: Secondary | ICD-10-CM | POA: Diagnosis not present

## 2024-01-16 LAB — RAD ONC ARIA SESSION SUMMARY
Course Elapsed Days: 19
Plan Fractions Treated to Date: 13
Plan Prescribed Dose Per Fraction: 2.66 Gy
Plan Total Fractions Prescribed: 16
Plan Total Prescribed Dose: 42.56 Gy
Reference Point Dosage Given to Date: 34.58 Gy
Reference Point Session Dosage Given: 2.66 Gy
Session Number: 13

## 2024-01-17 ENCOUNTER — Ambulatory Visit
Admission: RE | Admit: 2024-01-17 | Discharge: 2024-01-17 | Disposition: A | Discharge status: Home / Self Care | Source: Ambulatory Visit | Attending: Radiation Oncology

## 2024-01-17 ENCOUNTER — Other Ambulatory Visit: Payer: Self-pay

## 2024-01-17 DIAGNOSIS — C50511 Malignant neoplasm of lower-outer quadrant of right female breast: Secondary | ICD-10-CM | POA: Diagnosis not present

## 2024-01-17 DIAGNOSIS — Z171 Estrogen receptor negative status [ER-]: Secondary | ICD-10-CM | POA: Diagnosis not present

## 2024-01-17 DIAGNOSIS — Z510 Encounter for antineoplastic radiation therapy: Secondary | ICD-10-CM | POA: Diagnosis not present

## 2024-01-17 LAB — RAD ONC ARIA SESSION SUMMARY
Course Elapsed Days: 20
Plan Fractions Treated to Date: 14
Plan Prescribed Dose Per Fraction: 2.66 Gy
Plan Total Fractions Prescribed: 16
Plan Total Prescribed Dose: 42.56 Gy
Reference Point Dosage Given to Date: 37.24 Gy
Reference Point Session Dosage Given: 2.66 Gy
Session Number: 14

## 2024-01-18 ENCOUNTER — Ambulatory Visit
Admission: RE | Admit: 2024-01-18 | Discharge: 2024-01-18 | Disposition: A | Discharge status: Home / Self Care | Source: Ambulatory Visit | Attending: Radiation Oncology | Admitting: Radiation Oncology

## 2024-01-18 ENCOUNTER — Other Ambulatory Visit: Payer: Self-pay

## 2024-01-18 DIAGNOSIS — Z510 Encounter for antineoplastic radiation therapy: Secondary | ICD-10-CM | POA: Diagnosis not present

## 2024-01-18 DIAGNOSIS — Z171 Estrogen receptor negative status [ER-]: Secondary | ICD-10-CM | POA: Diagnosis not present

## 2024-01-18 DIAGNOSIS — C50511 Malignant neoplasm of lower-outer quadrant of right female breast: Secondary | ICD-10-CM | POA: Diagnosis not present

## 2024-01-18 LAB — RAD ONC ARIA SESSION SUMMARY
Course Elapsed Days: 21
Plan Fractions Treated to Date: 15
Plan Prescribed Dose Per Fraction: 2.66 Gy
Plan Total Fractions Prescribed: 16
Plan Total Prescribed Dose: 42.56 Gy
Reference Point Dosage Given to Date: 39.9 Gy
Reference Point Session Dosage Given: 2.66 Gy
Session Number: 15

## 2024-01-19 ENCOUNTER — Other Ambulatory Visit: Payer: Self-pay

## 2024-01-19 ENCOUNTER — Ambulatory Visit (HOSPITAL_COMMUNITY)
Admission: RE | Admit: 2024-01-19 | Discharge: 2024-01-19 | Disposition: A | Discharge status: Home / Self Care | Source: Ambulatory Visit | Attending: Hematology and Oncology

## 2024-01-19 ENCOUNTER — Ambulatory Visit
Admission: RE | Admit: 2024-01-19 | Discharge: 2024-01-19 | Disposition: A | Discharge status: Home / Self Care | Source: Ambulatory Visit | Attending: Radiation Oncology

## 2024-01-19 ENCOUNTER — Ambulatory Visit

## 2024-01-19 ENCOUNTER — Ambulatory Visit
Admission: RE | Admit: 2024-01-19 | Discharge: 2024-01-19 | Disposition: A | Discharge status: Home / Self Care | Source: Ambulatory Visit | Attending: Radiation Oncology | Admitting: Radiation Oncology

## 2024-01-19 DIAGNOSIS — Z171 Estrogen receptor negative status [ER-]: Secondary | ICD-10-CM | POA: Insufficient documentation

## 2024-01-19 DIAGNOSIS — Z452 Encounter for adjustment and management of vascular access device: Secondary | ICD-10-CM | POA: Insufficient documentation

## 2024-01-19 DIAGNOSIS — Z510 Encounter for antineoplastic radiation therapy: Secondary | ICD-10-CM | POA: Diagnosis not present

## 2024-01-19 DIAGNOSIS — C50511 Malignant neoplasm of lower-outer quadrant of right female breast: Secondary | ICD-10-CM | POA: Diagnosis not present

## 2024-01-19 HISTORY — PX: IR REMOVAL TUN ACCESS W/ PORT W/O FL MOD SED: IMG2290

## 2024-01-19 LAB — RAD ONC ARIA SESSION SUMMARY
Course Elapsed Days: 22
Plan Fractions Treated to Date: 16
Plan Prescribed Dose Per Fraction: 2.66 Gy
Plan Total Fractions Prescribed: 16
Plan Total Prescribed Dose: 42.56 Gy
Reference Point Dosage Given to Date: 42.56 Gy
Reference Point Session Dosage Given: 2.66 Gy
Session Number: 16

## 2024-01-19 MED ORDER — LIDOCAINE-EPINEPHRINE 1 %-1:100000 IJ SOLN
INTRAMUSCULAR | Status: AC
Start: 2024-01-19 — End: 2024-01-19
  Filled 2024-01-19: qty 1

## 2024-01-19 MED ORDER — LIDOCAINE-EPINEPHRINE 1 %-1:100000 IJ SOLN
20.0000 mL | Freq: Once | INTRAMUSCULAR | Status: AC
  Administered 2024-01-19: 10 mL via INTRADERMAL

## 2024-01-19 NOTE — Procedures (Signed)
  Procedure:  L chest port catheter removal   Preprocedure diagnosis: The encounter diagnosis was Malignant neoplasm of lower-outer quadrant of right breast of female, estrogen receptor negative (HCC). Postprocedure diagnosis: same EBL:    minimal Complications:   none immediate  See full dictation in [employer].  CHARM Toribio Faes MD Main # [phone] Pager  [phone] Mobile [phone]

## 2024-01-22 ENCOUNTER — Ambulatory Visit

## 2024-01-22 ENCOUNTER — Other Ambulatory Visit: Payer: Self-pay

## 2024-01-22 ENCOUNTER — Ambulatory Visit
Admission: RE | Admit: 2024-01-22 | Discharge: 2024-01-22 | Disposition: A | Discharge status: Home / Self Care | Source: Ambulatory Visit | Attending: Radiation Oncology

## 2024-01-22 DIAGNOSIS — Z171 Estrogen receptor negative status [ER-]: Secondary | ICD-10-CM | POA: Diagnosis not present

## 2024-01-22 DIAGNOSIS — C50511 Malignant neoplasm of lower-outer quadrant of right female breast: Secondary | ICD-10-CM | POA: Diagnosis not present

## 2024-01-22 DIAGNOSIS — Z510 Encounter for antineoplastic radiation therapy: Secondary | ICD-10-CM | POA: Diagnosis not present

## 2024-01-22 LAB — RAD ONC ARIA SESSION SUMMARY
Course Elapsed Days: 25
Plan Fractions Treated to Date: 1
Plan Prescribed Dose Per Fraction: 2 Gy
Plan Total Fractions Prescribed: 4
Plan Total Prescribed Dose: 8 Gy
Reference Point Dosage Given to Date: 2 Gy
Reference Point Session Dosage Given: 2 Gy
Session Number: 17

## 2024-01-23 ENCOUNTER — Ambulatory Visit
Admission: RE | Admit: 2024-01-23 | Discharge: 2024-01-23 | Disposition: A | Discharge status: Home / Self Care | Source: Ambulatory Visit | Attending: Radiation Oncology | Admitting: Radiation Oncology

## 2024-01-23 ENCOUNTER — Other Ambulatory Visit: Payer: Self-pay

## 2024-01-23 DIAGNOSIS — Z171 Estrogen receptor negative status [ER-]: Secondary | ICD-10-CM | POA: Diagnosis not present

## 2024-01-23 DIAGNOSIS — Z741 Need for assistance with personal care: Secondary | ICD-10-CM | POA: Diagnosis not present

## 2024-01-23 DIAGNOSIS — Z510 Encounter for antineoplastic radiation therapy: Secondary | ICD-10-CM | POA: Diagnosis present

## 2024-01-23 DIAGNOSIS — C50511 Malignant neoplasm of lower-outer quadrant of right female breast: Secondary | ICD-10-CM | POA: Diagnosis not present

## 2024-01-23 LAB — RAD ONC ARIA SESSION SUMMARY
Course Elapsed Days: 26
Plan Fractions Treated to Date: 2
Plan Prescribed Dose Per Fraction: 2 Gy
Plan Total Fractions Prescribed: 4
Plan Total Prescribed Dose: 8 Gy
Reference Point Dosage Given to Date: 4 Gy
Reference Point Session Dosage Given: 2 Gy
Session Number: 18

## 2024-01-24 ENCOUNTER — Ambulatory Visit

## 2024-01-24 ENCOUNTER — Ambulatory Visit: Admitting: Hematology and Oncology

## 2024-01-24 ENCOUNTER — Ambulatory Visit
Admission: RE | Admit: 2024-01-24 | Discharge: 2024-01-24 | Disposition: A | Discharge status: Home / Self Care | Source: Ambulatory Visit | Attending: Radiation Oncology | Admitting: Radiation Oncology

## 2024-01-24 ENCOUNTER — Other Ambulatory Visit: Payer: Self-pay

## 2024-01-24 DIAGNOSIS — Z741 Need for assistance with personal care: Secondary | ICD-10-CM | POA: Diagnosis not present

## 2024-01-24 DIAGNOSIS — Z510 Encounter for antineoplastic radiation therapy: Secondary | ICD-10-CM | POA: Diagnosis not present

## 2024-01-24 LAB — RAD ONC ARIA SESSION SUMMARY
Course Elapsed Days: 27
Plan Fractions Treated to Date: 3
Plan Prescribed Dose Per Fraction: 2 Gy
Plan Total Fractions Prescribed: 4
Plan Total Prescribed Dose: 8 Gy
Reference Point Dosage Given to Date: 6 Gy
Reference Point Session Dosage Given: 2 Gy
Session Number: 19

## 2024-01-25 ENCOUNTER — Other Ambulatory Visit: Payer: Self-pay

## 2024-01-25 ENCOUNTER — Ambulatory Visit
Admission: RE | Admit: 2024-01-25 | Discharge: 2024-01-25 | Disposition: A | Discharge status: Home / Self Care | Source: Ambulatory Visit | Attending: Radiation Oncology | Admitting: Radiation Oncology

## 2024-01-25 DIAGNOSIS — Z171 Estrogen receptor negative status [ER-]: Secondary | ICD-10-CM | POA: Diagnosis not present

## 2024-01-25 DIAGNOSIS — C50511 Malignant neoplasm of lower-outer quadrant of right female breast: Secondary | ICD-10-CM | POA: Diagnosis not present

## 2024-01-25 DIAGNOSIS — Z741 Need for assistance with personal care: Secondary | ICD-10-CM | POA: Diagnosis not present

## 2024-01-25 DIAGNOSIS — Z510 Encounter for antineoplastic radiation therapy: Secondary | ICD-10-CM | POA: Diagnosis not present

## 2024-01-25 LAB — RAD ONC ARIA SESSION SUMMARY
Course Elapsed Days: 28
Plan Fractions Treated to Date: 4
Plan Prescribed Dose Per Fraction: 2 Gy
Plan Total Fractions Prescribed: 4
Plan Total Prescribed Dose: 8 Gy
Reference Point Dosage Given to Date: 8 Gy
Reference Point Session Dosage Given: 2 Gy
Session Number: 20

## 2024-01-26 DIAGNOSIS — Z741 Need for assistance with personal care: Secondary | ICD-10-CM | POA: Diagnosis not present

## 2024-01-27 DIAGNOSIS — Z741 Need for assistance with personal care: Secondary | ICD-10-CM | POA: Diagnosis not present

## 2024-01-28 DIAGNOSIS — Z741 Need for assistance with personal care: Secondary | ICD-10-CM | POA: Diagnosis not present

## 2024-01-29 ENCOUNTER — Other Ambulatory Visit: Payer: Self-pay

## 2024-01-29 ENCOUNTER — Other Ambulatory Visit (HOSPITAL_COMMUNITY): Payer: Self-pay

## 2024-01-29 DIAGNOSIS — Z741 Need for assistance with personal care: Secondary | ICD-10-CM | POA: Diagnosis not present

## 2024-01-29 NOTE — Radiation Completion Notes (Addendum)
  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    Intent: Curative     ==========DELIVERED PLANS==========  First Treatment Date: 2023-12-28 Last Treatment Date: 2024-01-25   Plan Name: Breast_R Site: Breast, Right High Tangent Fields Technique: 3D Mode: Photon Dose Per Fraction: 2.66 Gy Prescribed Dose (Delivered / Prescribed): 42.56 Gy / 42.56 Gy Prescribed Fxs (Delivered / Prescribed): 16 / 16   Plan Name: Breast_R_Bst Site: Breast, Right Technique: 3D Mode: Photon Dose Per Fraction: 2 Gy Prescribed Dose (Delivered / Prescribed): 8 Gy / 8 Gy Prescribed Fxs (Delivered / Prescribed): 4 / 4     ==========ON TREATMENT VISIT DATES========== 2023-12-29, 2024-01-05, 2024-01-12, 2024-01-19    See weekly On Treatment Notes in Epic for details in the Media tab (listed as Progress notes on the On Treatment Visit Dates listed above). The patient tolerated high tangent breast radiation. She developed fatigue and anticipated skin changes in the treatment field.   The patient will receive a call in about one month from the radiation oncology department. She will continue follow up with Dr. Gudena as well.      Donald KYM Husband, PAC

## 2024-01-29 NOTE — Progress Notes (Signed)
 Specialty Pharmacy Refill Coordination Note  Erika Lopez is a 58 y.o. female contacted today regarding refills of specialty medication(s) Capecitabine  (XELODA )   Patient requested Delivery   Delivery date: 01/31/24   Verified address: [address] [place] [address]   Medication will be filled on 01/30/24.

## 2024-01-29 NOTE — Progress Notes (Signed)
 Clinical Intervention Note  Clinical Intervention Notes: Patient asked questions regarding keeping up with her dose. She stated that she may have taken an extra day's worth of medication due to memory issues. Counseled patient on keeping track of medication on and off weeks by using a calendar and marking off day/times when she takes the dose to ensure she has taken the correct dose the right amount of times per day and number of days per week. Patient agreed to try this and had no further questions.   Clinical Intervention Outcomes: Improved therapy adherence   [facility] Specialty Pharmacist

## 2024-01-30 ENCOUNTER — Other Ambulatory Visit: Payer: Self-pay

## 2024-01-30 DIAGNOSIS — Z741 Need for assistance with personal care: Secondary | ICD-10-CM | POA: Diagnosis not present

## 2024-01-31 ENCOUNTER — Other Ambulatory Visit: Payer: Self-pay

## 2024-01-31 DIAGNOSIS — Z741 Need for assistance with personal care: Secondary | ICD-10-CM | POA: Diagnosis not present

## 2024-01-31 NOTE — Progress Notes (Signed)
 Specialty Pharmacy Ongoing Clinical Assessment Note  Erika Lopez is a 58 y.o. female who is being followed by the specialty pharmacy service for RxSp Oncology   Patient's specialty medication(s) reviewed today: Capecitabine  (XELODA )   Missed doses in the last 4 weeks: 0   Patient/Caregiver did not have any additional questions or concerns.   Therapeutic benefit summary: Unable to assess   Adverse events/side effects summary: No adverse events/side effects   Patient's therapy is appropriate to: Continue    Goals Addressed             This Visit's Progress    Maintain optimal adherence to therapy   On track    Patient is on track. Patient will maintain adherence         Follow up: 3 months  [facility] Specialty Pharmacist

## 2024-01-31 NOTE — Assessment & Plan Note (Signed)
 02/22/2023: [facility]: Palpable mass, mammogram detected right breast mass 5 cm from the nipple 1.6 cm, separate oval circumscribed hypoechoic mass 7 mm (?  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%, skin satellite nodule biopsy positive   Treatment plan: Neoadjuvant chemotherapy with Taxol carbo Keytruda  every 3 weeks x 4 (completed at [facility]), received first cycle of Adriamycin  Cytoxan  Keytruda  on 08/01/2022: Plan to complete keynote 522 regimen with cycle 2 Adriamycin  Cytoxan  Keytruda  to be given 08/23/2023, followed by Keytruda  maintenance (patient had serious adverse effects and discontinued Keytruda ) 11/07/2023: Right lumpectomy: Grade 3 IDC 1.8 cm, margins negative, negative for lymphovascular or perineural invasion, 0/3 lymph nodes negative Adjuvant radiation therapy started 12/29/2023 -------------------------------------------------------------------------------------------------------------------- Chemotherapy-induced anemia Tachycardia: Prescribed metoprolol  25 mg p.o. twice daily Financial distress   Fracture of the left arm: From the recent fall.  I encouraged her to go to [facility] urgent care and get it evaluated. We are discontinuing Keytruda  maintenance.  I will request for the port removal.  Current Treatment: Capecitabine  Maintenance Capecitabine  Toxicities:    All of her appointments have to coincide with her transportation for radiation.

## 2024-02-01 ENCOUNTER — Other Ambulatory Visit (HOSPITAL_COMMUNITY): Payer: Self-pay

## 2024-02-01 ENCOUNTER — Other Ambulatory Visit: Payer: Self-pay

## 2024-02-01 ENCOUNTER — Inpatient Hospital Stay: Attending: Hematology and Oncology

## 2024-02-01 ENCOUNTER — Inpatient Hospital Stay (HOSPITAL_BASED_OUTPATIENT_CLINIC_OR_DEPARTMENT_OTHER): Admitting: Hematology and Oncology

## 2024-02-01 VITALS — BP 126/69 | HR 98 | Temp 98.3°F | Resp 17 | Ht 69.5 in | Wt 174.9 lb

## 2024-02-01 DIAGNOSIS — Z1732 Human epidermal growth factor receptor 2 negative status: Secondary | ICD-10-CM | POA: Diagnosis not present

## 2024-02-01 DIAGNOSIS — Z741 Need for assistance with personal care: Secondary | ICD-10-CM | POA: Diagnosis not present

## 2024-02-01 DIAGNOSIS — K5900 Constipation, unspecified: Secondary | ICD-10-CM | POA: Diagnosis not present

## 2024-02-01 DIAGNOSIS — T451X5A Adverse effect of antineoplastic and immunosuppressive drugs, initial encounter: Secondary | ICD-10-CM | POA: Diagnosis not present

## 2024-02-01 DIAGNOSIS — D6481 Anemia due to antineoplastic chemotherapy: Secondary | ICD-10-CM | POA: Insufficient documentation

## 2024-02-01 DIAGNOSIS — R530 Neoplastic (malignant) related fatigue: Secondary | ICD-10-CM | POA: Insufficient documentation

## 2024-02-01 DIAGNOSIS — C773 Secondary and unspecified malignant neoplasm of axilla and upper limb lymph nodes: Secondary | ICD-10-CM | POA: Insufficient documentation

## 2024-02-01 DIAGNOSIS — G620 Drug-induced polyneuropathy: Secondary | ICD-10-CM | POA: Diagnosis not present

## 2024-02-01 DIAGNOSIS — R000 Tachycardia, unspecified: Secondary | ICD-10-CM | POA: Diagnosis not present

## 2024-02-01 DIAGNOSIS — R4189 Other symptoms and signs involving cognitive functions and awareness: Secondary | ICD-10-CM | POA: Diagnosis not present

## 2024-02-01 DIAGNOSIS — C50511 Malignant neoplasm of lower-outer quadrant of right female breast: Secondary | ICD-10-CM

## 2024-02-01 DIAGNOSIS — Z9221 Personal history of antineoplastic chemotherapy: Secondary | ICD-10-CM | POA: Diagnosis not present

## 2024-02-01 DIAGNOSIS — Z1722 Progesterone receptor negative status: Secondary | ICD-10-CM | POA: Insufficient documentation

## 2024-02-01 DIAGNOSIS — Z171 Estrogen receptor negative status [ER-]: Secondary | ICD-10-CM

## 2024-02-01 LAB — CMP (CANCER CENTER ONLY)
ALT: 11 U/L (ref 0–44)
AST: 14 U/L — ABNORMAL LOW (ref 15–41)
Albumin: 4 g/dL (ref 3.5–5.0)
Alkaline Phosphatase: 108 U/L (ref 38–126)
Anion gap: 5 (ref 5–15)
BUN: 12 mg/dL (ref 6–20)
CO2: 30 mmol/L (ref 22–32)
Calcium: 9.3 mg/dL (ref 8.9–10.3)
Chloride: 106 mmol/L (ref 98–111)
Creatinine: 0.64 mg/dL (ref 0.44–1.00)
GFR, Estimated: >60 mL/min (ref >60)
Glucose, Bld: 98 mg/dL (ref 70–99)
Potassium: 3.8 mmol/L (ref 3.5–5.1)
Sodium: 141 mmol/L (ref 135–145)
Total Bilirubin: 0.3 mg/dL (ref 0.0–1.2)
Total Protein: 7 g/dL (ref 6.5–8.1)

## 2024-02-01 LAB — CBC WITH DIFFERENTIAL (CANCER CENTER ONLY)
Abs Immature Granulocytes: 0.01 K/uL (ref 0.00–0.07)
Basophils Absolute: 0 K/uL (ref 0.0–0.1)
Basophils Relative: 0 %
Eosinophils Absolute: 0.1 K/uL (ref 0.0–0.5)
Eosinophils Relative: 2 %
HCT: 37.1 % (ref 36.0–46.0)
Hemoglobin: 12.9 g/dL (ref 12.0–15.0)
Immature Granulocytes: 0 %
Lymphocytes Relative: 23 %
Lymphs Abs: 1.1 K/uL (ref 0.7–4.0)
MCH: 27.7 pg (ref 26.0–34.0)
MCHC: 34.8 g/dL (ref 30.0–36.0)
MCV: 79.8 fL — ABNORMAL LOW (ref 80.0–100.0)
Monocytes Absolute: 0.6 K/uL (ref 0.1–1.0)
Monocytes Relative: 11 %
Neutro Abs: 3.1 K/uL (ref 1.7–7.7)
Neutrophils Relative %: 64 %
Platelet Count: 201 K/uL (ref 150–400)
RBC: 4.65 MIL/uL (ref 3.87–5.11)
RDW: 15 % (ref 11.5–15.5)
WBC Count: 4.9 K/uL (ref 4.0–10.5)
nRBC: 0 % (ref 0.0–0.2)

## 2024-02-01 MED ORDER — CAPECITABINE 500 MG PO TABS
1500.0000 mg | ORAL_TABLET | Freq: Two times a day (BID) | ORAL | 7 refills | Status: DC
  Filled 2024-02-01 – 2024-02-12 (×3): qty 84, 21d supply, fill #0
  Filled 2024-02-28: qty 84, 21d supply, fill #1
  Filled 2024-03-14 – 2024-03-15 (×2): qty 84, 21d supply, fill #2
  Filled 2024-04-02: qty 84, 21d supply, fill #3
  Filled 2024-04-17: qty 84, 21d supply, fill #4
  Filled 2024-05-08: qty 84, 21d supply, fill #5
  Filled 2024-05-30 – 2024-06-03 (×2): qty 84, 21d supply, fill #6
  Filled 2024-06-19: qty 84, 21d supply, fill #7

## 2024-02-01 NOTE — Progress Notes (Signed)
 Patient Care Team: [name], NP as PCP - General (Nurse Practitioner) Odean Potts, MD as Consulting Physician (Hematology and Oncology) Glean Stephane BROCKS, RN (Inactive) as Oncology Nurse Navigator Tyree Nanetta SAILOR, RN as Oncology Nurse Navigator Curvin Deward MOULD, MD as Consulting Physician (General Surgery)  DIAGNOSIS:  Encounter Diagnosis  Name Primary?   Malignant neoplasm of lower-outer quadrant of right breast of female, estrogen receptor negative (HCC) Yes    SUMMARY OF ONCOLOGIC HISTORY: Oncology History  Malignant neoplasm of lower-outer quadrant of right breast of female, estrogen receptor negative (HCC)  02/22/2023 Initial Diagnosis   [facility]: Palpable mass, mammogram detected right breast mass 5 cm from the nipple 1.6 cm, separate oval circumscribed hypoechoic mass 7 mm (?  Intramammary lymph node: Benign) grouped amorphous calcifications UOQ left breast 7 mm (benign), right axillary lymph node biopsy negative Biopsy: Poorly differentiated IDC ER less than 1%, PR less than 1%, HER2 0, Ki-67 90%, skin satellite nodule biopsy positive   04/28/2023 -  Neo-Adjuvant Chemotherapy   Neoadjuvant chemotherapy with Taxol carboplatin (every 3 weeks x 4) Keytruda  followed by Adriamycin  Cytoxan  Keytruda  (Dr. Selinda Vicenta Albino at [facility]): AC-K cycle 1 given on 08/01/2022 at [facility] (echo 04/25/2023: EF 55 to 60%, strain -18.5%)   08/04/2023 Cancer Staging   Staging form: Breast, AJCC 8th Edition - Clinical: Stage IIIC (cT4b, cN0, cM0, G3, ER-, PR-, HER2-) - Signed by Odean Potts, MD on 08/04/2023 Stage prefix: Initial diagnosis Histologic grading system: 3 grade system   08/04/2023 - 10/07/2023 Chemotherapy   Patient is on Treatment Plan : BREAST Pembrolizumab  (200) D1 + Carboplatin (5) D1 + Paclitaxel (80) D1,8,15 q21d X 4 cycles / Pembrolizumab  (200) D1 + AC D1 q21d x 4 cycles       CHIEF COMPLIANT: Follow-up on capecitabine   History of Present Illness Erika Lopez is a  58 year old female who presents for follow-up regarding her capecitabine  treatment.  She is currently on capecitabine , taking two tablets in the morning and two in the evening. She tolerates this regimen well, with constipation as the primary side effect. She manages constipation with Miralax and occasionally a liquid laxative, which takes about four days to work. She has not experienced diarrhea. Her blood counts, including white count, hemoglobin, and platelets, are normal. She has completed two weeks of treatment and is in an off week, planning to resume treatment on Saturday.     ALLERGIES:  is allergic to wound dressing adhesive.  MEDICATIONS:  Current Outpatient Medications  Medication Sig Dispense Refill   capecitabine  (XELODA ) 500 MG tablet Take 3 tablets (1,500 mg total) by mouth 2 (two) times daily after a meal. 14 days on and 7 days off 84 tablet 7   diphenhydrAMINE (BENADRYL) 50 MG capsule Take 50 mg by mouth every 6 (six) hours as needed for allergies or itching.     gabapentin  (NEURONTIN ) 100 MG capsule Take 1 capsule (100 mg total) by mouth 3 (three) times daily. 90 capsule 0   meloxicam  (MOBIC ) 7.5 MG tablet Take 1 tablet (7.5 mg total) by mouth daily. 14 tablet 0   metoprolol  tartrate (LOPRESSOR ) 25 MG tablet Take 1 tablet (25 mg total) by mouth 2 (two) times daily. 60 tablet 3   oxyCODONE  (ROXICODONE ) 5 MG immediate release tablet Take 1 tablet (5 mg total) by mouth every 6 (six) hours as needed for severe pain (pain score 7-10). 20 tablet 0   No current facility-administered medications for this visit.  PHYSICAL EXAMINATION: ECOG PERFORMANCE STATUS: 1 - Symptomatic but completely ambulatory  Vitals:   02/01/24 1446 02/01/24 1447  BP: (!) 123/53 126/69  Pulse: 98   Resp: 17   Temp: 98.3 F (36.8 C)   SpO2: 100%    Filed Weights   02/01/24 1446  Weight: 174 lb 14.4 oz (79.3 kg)     LABORATORY DATA:  I have reviewed the data as listed    Latest Ref Rng &  Units 02/01/2024    2:15 PM 01/10/2024   10:51 AM 11/03/2023   10:40 AM  CMP  Glucose 70 - 99 mg/dL 98  89  76   BUN 6 - 20 mg/dL 12  6  7    Creatinine 0.44 - 1.00 mg/dL 9.35  9.41  9.44   Sodium 135 - 145 mmol/L 141  141  140   Potassium 3.5 - 5.1 mmol/L 3.8  3.9  4.1   Chloride 98 - 111 mmol/L 106  106  105   CO2 22 - 32 mmol/L 30  28  26    Calcium 8.9 - 10.3 mg/dL 9.3  9.1  9.3   Total Protein 6.5 - 8.1 g/dL 7.0  7.3    Total Bilirubin 0.0 - 1.2 mg/dL 0.3  0.5    Alkaline Phos 38 - 126 U/L 108  113    AST 15 - 41 U/L 14  17    ALT 0 - 44 U/L 11  13      Lab Results  Component Value Date   WBC 4.9 02/01/2024   HGB 12.9 02/01/2024   HCT 37.1 02/01/2024   MCV 79.8 (L) 02/01/2024   PLT 201 02/01/2024   NEUTROABS 3.1 02/01/2024    ASSESSMENT & PLAN:  Malignant neoplasm of lower-outer quadrant of right breast of female, estrogen receptor negative (HCC) 02/22/2023: [facility]: Palpable mass, mammogram detected right breast mass 5 cm from the nipple 1.6 cm, separate oval circumscribed hypoechoic mass 7 mm (?  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%, skin satellite nodule biopsy positive   Treatment plan: Neoadjuvant chemotherapy with Taxol carbo Keytruda  every 3 weeks x 4 (completed at [facility]), received first cycle of Adriamycin  Cytoxan  Keytruda  on 08/01/2022: Plan to complete keynote 522 regimen with cycle 2 Adriamycin  Cytoxan  Keytruda  to be given 08/23/2023, followed by Keytruda  maintenance (patient had serious adverse effects and discontinued Keytruda ) 11/07/2023: Right lumpectomy: Grade 3 IDC 1.8 cm, margins negative, negative for lymphovascular or perineural invasion, 0/3 lymph nodes negative Adjuvant radiation therapy started  12/29/2023 -------------------------------------------------------------------------------------------------------------------- Chemotherapy-induced anemia Tachycardia: Prescribed metoprolol  25 mg p.o. twice daily Financial distress   Current Treatment: Capecitabine  Maintenance started 01/12/2024 Capecitabine  Toxicities: None I recommended increasing the dosage of capecitabine  to 1500 mg p.o. twice daily starting this Saturday. I sent a new prescription to [facility].  Return to clinic in 3 weeks with labs and to assess tolerance to capecitabine . ------------------------------------- Assessment and Plan Assessment & Plan Malignant neoplasm of lower-outer quadrant of right breast, estrogen receptor negative (HCC) Tolerated initial capecitabine  dose well. Plan to increase dose based on tolerance and height for improved outcomes. - Increase capecitabine  dose to 1500 mg (3 tablets) in the morning and 1500 mg in the evening. - Monitor for side effects, particularly diarrhea, which may help with constipation. - Follow up in three weeks with  labs to assess tolerance and effectiveness.  Chemotherapy-induced anemia Blood counts normal, hemoglobin 12.9, indicating well-managed anemia.  Constipation Increased capecitabine  dose may help alleviate constipation due to diarrhea side effect.  Cognitive impairment (Chemo brain) Reports memory issues, interested in trial using computer games for memory impairment.      No orders of the defined types were placed in this encounter.  The patient has a good understanding of the overall plan. she agrees with it. she will call with any problems that may develop before the next visit here. Total time spent: 30 mins including face to face time and time spent for planning, charting and co-ordination of care   Viinay K Suede [place], MD 02/01/24

## 2024-02-02 ENCOUNTER — Encounter: Payer: Self-pay | Admitting: Hematology and Oncology

## 2024-02-02 ENCOUNTER — Other Ambulatory Visit (HOSPITAL_COMMUNITY): Payer: Self-pay

## 2024-02-02 ENCOUNTER — Other Ambulatory Visit: Payer: Self-pay

## 2024-02-02 DIAGNOSIS — Z741 Need for assistance with personal care: Secondary | ICD-10-CM | POA: Diagnosis not present

## 2024-02-02 NOTE — Progress Notes (Signed)
 Per Estefana,  [place] refuses to override DUR rejects to allow early fill , vm left with pt's mother to see if they want to pay cash for tablets needed to complete current cycle. confirm cycle start date (per last OV not starting new cycle until 7/12). I have left a note for Cassie about the situation -CK

## 2024-02-03 DIAGNOSIS — Z741 Need for assistance with personal care: Secondary | ICD-10-CM | POA: Diagnosis not present

## 2024-02-04 DIAGNOSIS — Z741 Need for assistance with personal care: Secondary | ICD-10-CM | POA: Diagnosis not present

## 2024-02-05 ENCOUNTER — Other Ambulatory Visit: Payer: Self-pay

## 2024-02-05 ENCOUNTER — Other Ambulatory Visit (HOSPITAL_COMMUNITY): Payer: Self-pay

## 2024-02-05 DIAGNOSIS — Z741 Need for assistance with personal care: Secondary | ICD-10-CM | POA: Diagnosis not present

## 2024-02-06 ENCOUNTER — Other Ambulatory Visit: Payer: Self-pay

## 2024-02-06 ENCOUNTER — Other Ambulatory Visit (HOSPITAL_COMMUNITY): Payer: Self-pay

## 2024-02-06 DIAGNOSIS — Z741 Need for assistance with personal care: Secondary | ICD-10-CM | POA: Diagnosis not present

## 2024-02-07 ENCOUNTER — Other Ambulatory Visit (HOSPITAL_COMMUNITY): Payer: Self-pay

## 2024-02-07 DIAGNOSIS — Z741 Need for assistance with personal care: Secondary | ICD-10-CM | POA: Diagnosis not present

## 2024-02-08 ENCOUNTER — Telehealth: Payer: Self-pay

## 2024-02-08 ENCOUNTER — Other Ambulatory Visit: Payer: Self-pay

## 2024-02-08 ENCOUNTER — Other Ambulatory Visit (HOSPITAL_COMMUNITY): Payer: Self-pay

## 2024-02-08 DIAGNOSIS — Z741 Need for assistance with personal care: Secondary | ICD-10-CM | POA: Diagnosis not present

## 2024-02-08 NOTE — Telephone Encounter (Signed)
 Oral Oncology Patient Advocate Encounter   This patient changed their capecitabine  dose from total of 4 tabs daily to 6 tabs daily at their OV on 7/10. Medicaid will not allow pharmacy to place an override for an early fill and patient will be short ~5 days of treatment (28 tabs). Patient can pay cash for these #28 tabs ($18.76). Discussed with Dr. Odean who stated that he is ok  if patient does not have the 5 days or if they pay cash for a 5 day supply. Discussed with Cam at call center and he will continue to attempt to reach patient an additional 2 times. I will sign off at this time. Speciality is  aware to notify me if they are unable to reach the patient.   Charlott Hamilton,  CPhT-Adv  she/her/hers [facility]  [facility] Pharmacy Technician Patient Advocate Specialist III [facility] Phone: [phone]  Fax: [phone] [email or web address]

## 2024-02-09 ENCOUNTER — Other Ambulatory Visit: Payer: Self-pay

## 2024-02-09 ENCOUNTER — Other Ambulatory Visit (HOSPITAL_COMMUNITY): Payer: Self-pay

## 2024-02-09 ENCOUNTER — Other Ambulatory Visit: Payer: Self-pay | Admitting: Pharmacy Technician

## 2024-02-09 DIAGNOSIS — Z741 Need for assistance with personal care: Secondary | ICD-10-CM | POA: Diagnosis not present

## 2024-02-09 NOTE — Progress Notes (Signed)
 Specialty Pharmacy Refill Coordination Note  Erika Lopez is a 58 y.o. female contacted today regarding refills of specialty medication(s) Capecitabine  (XELODA )   Patient requested Delivery   Delivery date: 02/12/24   Verified address: [address]  [place]   Medication will be filled on 02/12/24.   Same Day Courier Message sent to [facility] & Ole will follow up on Monday as well.

## 2024-02-10 DIAGNOSIS — Z741 Need for assistance with personal care: Secondary | ICD-10-CM | POA: Diagnosis not present

## 2024-02-11 DIAGNOSIS — Z741 Need for assistance with personal care: Secondary | ICD-10-CM | POA: Diagnosis not present

## 2024-02-12 ENCOUNTER — Other Ambulatory Visit: Payer: Self-pay

## 2024-02-12 ENCOUNTER — Telehealth: Payer: Self-pay

## 2024-02-12 ENCOUNTER — Other Ambulatory Visit (HOSPITAL_COMMUNITY): Payer: Self-pay

## 2024-02-12 DIAGNOSIS — Z741 Need for assistance with personal care: Secondary | ICD-10-CM | POA: Diagnosis not present

## 2024-02-12 DIAGNOSIS — Z853 Personal history of malignant neoplasm of breast: Secondary | ICD-10-CM

## 2024-02-12 NOTE — Progress Notes (Signed)
 Complex Care Management Note Care Guide Note  02/12/2024 Name: Erika Lopez MRN: [id number] DOB: 1965-12-02   Complex Care Management Outreach Attempts: An unsuccessful telephone outreach was attempted today to offer the patient information about available complex care management services.  Follow Up Plan:  Additional outreach attempts will be made to offer the patient complex care management information and services.   Encounter Outcome:  No Answer  Leotis Rase [facility], [facility] Guide  Direct Dial: [phone]  Fax [phone]

## 2024-02-13 DIAGNOSIS — Z741 Need for assistance with personal care: Secondary | ICD-10-CM | POA: Diagnosis not present

## 2024-02-14 DIAGNOSIS — Z741 Need for assistance with personal care: Secondary | ICD-10-CM | POA: Diagnosis not present

## 2024-02-15 DIAGNOSIS — Z741 Need for assistance with personal care: Secondary | ICD-10-CM | POA: Diagnosis not present

## 2024-02-16 DIAGNOSIS — Z741 Need for assistance with personal care: Secondary | ICD-10-CM | POA: Diagnosis not present

## 2024-02-17 DIAGNOSIS — Z741 Need for assistance with personal care: Secondary | ICD-10-CM | POA: Diagnosis not present

## 2024-02-18 DIAGNOSIS — Z741 Need for assistance with personal care: Secondary | ICD-10-CM | POA: Diagnosis not present

## 2024-02-19 DIAGNOSIS — Z741 Need for assistance with personal care: Secondary | ICD-10-CM | POA: Diagnosis not present

## 2024-02-20 DIAGNOSIS — Z741 Need for assistance with personal care: Secondary | ICD-10-CM | POA: Diagnosis not present

## 2024-02-21 DIAGNOSIS — Z741 Need for assistance with personal care: Secondary | ICD-10-CM | POA: Diagnosis not present

## 2024-02-22 ENCOUNTER — Inpatient Hospital Stay

## 2024-02-22 ENCOUNTER — Inpatient Hospital Stay (HOSPITAL_BASED_OUTPATIENT_CLINIC_OR_DEPARTMENT_OTHER): Admitting: Hematology and Oncology

## 2024-02-22 VITALS — BP 134/77 | HR 77 | Temp 97.6°F | Resp 17 | Ht 69.5 in | Wt 175.5 lb

## 2024-02-22 DIAGNOSIS — C50511 Malignant neoplasm of lower-outer quadrant of right female breast: Secondary | ICD-10-CM | POA: Diagnosis not present

## 2024-02-22 DIAGNOSIS — Z741 Need for assistance with personal care: Secondary | ICD-10-CM | POA: Diagnosis not present

## 2024-02-22 DIAGNOSIS — Z171 Estrogen receptor negative status [ER-]: Secondary | ICD-10-CM | POA: Diagnosis not present

## 2024-02-22 LAB — CMP (CANCER CENTER ONLY)
ALT: 14 U/L (ref 0–44)
AST: 16 U/L (ref 15–41)
Albumin: 4.1 g/dL (ref 3.5–5.0)
Alkaline Phosphatase: 105 U/L (ref 38–126)
Anion gap: 5 (ref 5–15)
BUN: 12 mg/dL (ref 6–20)
CO2: 32 mmol/L (ref 22–32)
Calcium: 9.3 mg/dL (ref 8.9–10.3)
Chloride: 105 mmol/L (ref 98–111)
Creatinine: 0.69 mg/dL (ref 0.44–1.00)
GFR, Estimated: >60 mL/min (ref >60)
Glucose, Bld: 91 mg/dL (ref 70–99)
Potassium: 4.1 mmol/L (ref 3.5–5.1)
Sodium: 142 mmol/L (ref 135–145)
Total Bilirubin: 0.5 mg/dL (ref 0.0–1.2)
Total Protein: 7.3 g/dL (ref 6.5–8.1)

## 2024-02-22 LAB — CBC WITH DIFFERENTIAL (CANCER CENTER ONLY)
Abs Immature Granulocytes: 0.01 K/uL (ref 0.00–0.07)
Basophils Absolute: 0 K/uL (ref 0.0–0.1)
Basophils Relative: 0 %
Eosinophils Absolute: 0.1 K/uL (ref 0.0–0.5)
Eosinophils Relative: 2 %
HCT: 36.8 % (ref 36.0–46.0)
Hemoglobin: 12.7 g/dL (ref 12.0–15.0)
Immature Granulocytes: 0 %
Lymphocytes Relative: 34 %
Lymphs Abs: 1.3 K/uL (ref 0.7–4.0)
MCH: 28.2 pg (ref 26.0–34.0)
MCHC: 34.5 g/dL (ref 30.0–36.0)
MCV: 81.8 fL (ref 80.0–100.0)
Monocytes Absolute: 0.4 K/uL (ref 0.1–1.0)
Monocytes Relative: 11 %
Neutro Abs: 1.9 K/uL (ref 1.7–7.7)
Neutrophils Relative %: 53 %
Platelet Count: 179 K/uL (ref 150–400)
RBC: 4.5 MIL/uL (ref 3.87–5.11)
RDW: 17.2 % — ABNORMAL HIGH (ref 11.5–15.5)
WBC Count: 3.7 K/uL — ABNORMAL LOW (ref 4.0–10.5)
nRBC: 0 % (ref 0.0–0.2)

## 2024-02-22 NOTE — Progress Notes (Signed)
 Patient Care Team: [name], NP as PCP - General (Nurse Practitioner) Odean Potts, MD as Consulting Physician (Hematology and Oncology) Glean Stephane BROCKS, RN (Inactive) as Oncology Nurse Navigator Tyree Nanetta SAILOR, RN as Oncology Nurse Navigator Curvin Deward MOULD, MD as Consulting Physician (General Surgery)  DIAGNOSIS:  Encounter Diagnosis  Name Primary?   Malignant neoplasm of lower-outer quadrant of right breast of female, estrogen receptor negative (HCC) Yes    SUMMARY OF ONCOLOGIC HISTORY: Oncology History  Malignant neoplasm of lower-outer quadrant of right breast of female, estrogen receptor negative (HCC)  02/22/2023 Initial Diagnosis   [facility]: Palpable mass, mammogram detected right breast mass 5 cm from the nipple 1.6 cm, separate oval circumscribed hypoechoic mass 7 mm (?  Intramammary lymph node: Benign) grouped amorphous calcifications UOQ left breast 7 mm (benign), right axillary lymph node biopsy negative Biopsy: Poorly differentiated IDC ER less than 1%, PR less than 1%, HER2 0, Ki-67 90%, skin satellite nodule biopsy positive   04/28/2023 -  Neo-Adjuvant Chemotherapy   Neoadjuvant chemotherapy with Taxol carboplatin (every 3 weeks x 4) Keytruda  followed by Adriamycin  Cytoxan  Keytruda  (Dr. Selinda Vicenta Albino at [facility]): AC-K cycle 1 given on 08/01/2022 at [facility] (echo 04/25/2023: EF 55 to 60%, strain -18.5%)   08/04/2023 Cancer Staging   Staging form: Breast, AJCC 8th Edition - Clinical: Stage IIIC (cT4b, cN0, cM0, G3, ER-, PR-, HER2-) - Signed by Odean Potts, MD on 08/04/2023 Stage prefix: Initial diagnosis Histologic grading system: 3 grade system   08/04/2023 - 10/07/2023 Chemotherapy   Patient is on Treatment Plan : BREAST Pembrolizumab  (200) D1 + Carboplatin (5) D1 + Paclitaxel (80) D1,8,15 q21d X 4 cycles / Pembrolizumab  (200) D1 + AC D1 q21d x 4 cycles       CHIEF COMPLIANT:   HISTORY OF PRESENT ILLNESS:   History of Present Illness Erika  Lopez is a 58 year old female who presents for follow-up regarding her Xeloda  medication regimen.  She has been on Xeloda  since June, with a recent dose increase to 1500 mg, taken as three pills twice daily. She experiences numbness, tingling, and cramping in her big toe, which has also become dark. She feels 'always high' and drowsy, attributing these symptoms to the aftereffects of radiation treatment. Despite these symptoms, she feels generally good. She reports no diarrhea or nausea.     ALLERGIES:  is allergic to wound dressing adhesive.  MEDICATIONS:  Current Outpatient Medications  Medication Sig Dispense Refill   capecitabine  (XELODA ) 500 MG tablet Take 3 tablets (1,500 mg total) by mouth 2 (two) times daily after a meal. 14 days on and 7 days off 84 tablet 7   diphenhydrAMINE (BENADRYL) 50 MG capsule Take 50 mg by mouth every 6 (six) hours as needed for allergies or itching.     gabapentin  (NEURONTIN ) 100 MG capsule Take 1 capsule (100 mg total) by mouth 3 (three) times daily. 90 capsule 0   meloxicam  (MOBIC ) 7.5 MG tablet Take 1 tablet (7.5 mg total) by mouth daily. 14 tablet 0   metoprolol  tartrate (LOPRESSOR ) 25 MG tablet Take 1 tablet (25 mg total) by mouth 2 (two) times daily. 60 tablet 3   oxyCODONE  (ROXICODONE ) 5 MG immediate release tablet Take 1 tablet (5 mg total) by mouth every 6 (six) hours as needed for severe pain (pain score 7-10). 20 tablet 0   No current facility-administered medications for this visit.    PHYSICAL EXAMINATION: ECOG PERFORMANCE STATUS: 1 - Symptomatic but completely ambulatory  Vitals:   02/22/24 1008  BP: 134/77  Pulse: 77  Resp: 17  Temp: 97.6 F (36.4 C)  SpO2: 100%   Filed Weights   02/22/24 1008  Weight: 175 lb 8 oz (79.6 kg)      LABORATORY DATA:  I have reviewed the data as listed    Latest Ref Rng & Units 02/01/2024    2:15 PM 01/10/2024   10:51 AM 11/03/2023   10:40 AM  CMP  Glucose 70 - 99 mg/dL 98  89  76   BUN 6 - 20  mg/dL 12  6  7    Creatinine 0.44 - 1.00 mg/dL 9.35  9.41  9.44   Sodium 135 - 145 mmol/L 141  141  140   Potassium 3.5 - 5.1 mmol/L 3.8  3.9  4.1   Chloride 98 - 111 mmol/L 106  106  105   CO2 22 - 32 mmol/L 30  28  26    Calcium 8.9 - 10.3 mg/dL 9.3  9.1  9.3   Total Protein 6.5 - 8.1 g/dL 7.0  7.3    Total Bilirubin 0.0 - 1.2 mg/dL 0.3  0.5    Alkaline Phos 38 - 126 U/L 108  113    AST 15 - 41 U/L 14  17    ALT 0 - 44 U/L 11  13      Lab Results  Component Value Date   WBC 3.7 (L) 02/22/2024   HGB 12.7 02/22/2024   HCT 36.8 02/22/2024   MCV 81.8 02/22/2024   PLT 179 02/22/2024   NEUTROABS 1.9 02/22/2024    ASSESSMENT & PLAN:  Malignant neoplasm of lower-outer quadrant of right breast of female, estrogen receptor negative (HCC) 02/22/2023: [facility]: Palpable mass, mammogram detected right breast mass 5 cm from the nipple 1.6 cm, separate oval circumscribed hypoechoic mass 7 mm (?  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%, skin satellite nodule biopsy positive   Treatment plan: Neoadjuvant chemotherapy with Taxol carbo Keytruda  every 3 weeks x 4 (completed at [facility]), received first cycle of Adriamycin  Cytoxan  Keytruda  on 08/01/2022: Plan to complete keynote 522 regimen with cycle 2 Adriamycin  Cytoxan  Keytruda  to be given 08/23/2023, followed by Keytruda  maintenance (patient had serious adverse effects and discontinued Keytruda ) 11/07/2023: Right lumpectomy: Grade 3 IDC 1.8 cm, margins negative, negative for lymphovascular or perineural invasion, 0/3 lymph nodes negative Adjuvant radiation therapy started 12/29/2023 -------------------------------------------------------------------------------------------------------------------- Chemotherapy-induced anemia Tachycardia: Prescribed metoprolol  25  mg p.o. twice daily Financial distress   Current Treatment: Capecitabine  Maintenance started 01/12/2024 (1500 mg p.o. twice daily 2 weeks on 1 week off) Capecitabine  Toxicities: None   Return to clinic in 2 months with labs and follow-up ------------------------------------- Assessment and Plan Assessment & Plan Malignant neoplasm of lower-outer quadrant of right breast, on adjuvant capecitabine  therapy On adjuvant capecitabine  1500 mg twice daily with no significant issues. Hemoglobin and platelet counts normal. Liver function tests pending but previously normal. Current dose optimal without adverse effects on blood counts. - Continue capecitabine  1500 mg oral twice daily. - Schedule follow-up labs and appointments every two months.  Chemotherapy-induced peripheral neuropathy Reports transient numbness and tingling in the big toe, resolved without intervention.  Fatigue secondary to cancer therapy Experiencing ongoing fatigue, expected post-radiation therapy, persistent but manageable. - Continue current management and monitor fatigue levels.  No orders of the defined types were placed in this encounter.  The patient has a good understanding of the overall plan. she agrees with it. she will call with any problems that may develop before the next visit here. Total time spent: 30 mins including face to face time and time spent for planning, charting and co-ordination of care   Viinay K Kara Melching, MD 02/22/24

## 2024-02-22 NOTE — Assessment & Plan Note (Signed)
 02/22/2023: [facility]: Palpable mass, mammogram detected right breast mass 5 cm from the nipple 1.6 cm, separate oval circumscribed hypoechoic mass 7 mm (?  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%, skin satellite nodule biopsy positive   Treatment plan: Neoadjuvant chemotherapy with Taxol carbo Keytruda  every 3 weeks x 4 (completed at [facility]), received first cycle of Adriamycin  Cytoxan  Keytruda  on 08/01/2022: Plan to complete keynote 522 regimen with cycle 2 Adriamycin  Cytoxan  Keytruda  to be given 08/23/2023, followed by Keytruda  maintenance (patient had serious adverse effects and discontinued Keytruda ) 11/07/2023: Right lumpectomy: Grade 3 IDC 1.8 cm, margins negative, negative for lymphovascular or perineural invasion, 0/3 lymph nodes negative Adjuvant radiation therapy started 12/29/2023 -------------------------------------------------------------------------------------------------------------------- Chemotherapy-induced anemia Tachycardia: Prescribed metoprolol  25 mg p.o. twice daily Financial distress   Current Treatment: Capecitabine  Maintenance started 01/12/2024 (1500 mg p.o. twice daily 2 weeks on 1 week off) Capecitabine  Toxicities: None   Return to clinic in 2 months with labs and follow-up

## 2024-02-23 DIAGNOSIS — Z741 Need for assistance with personal care: Secondary | ICD-10-CM | POA: Diagnosis not present

## 2024-02-24 DIAGNOSIS — Z741 Need for assistance with personal care: Secondary | ICD-10-CM | POA: Diagnosis not present

## 2024-02-25 DIAGNOSIS — Z741 Need for assistance with personal care: Secondary | ICD-10-CM | POA: Diagnosis not present

## 2024-02-26 ENCOUNTER — Other Ambulatory Visit: Payer: Self-pay

## 2024-02-26 ENCOUNTER — Ambulatory Visit
Admission: RE | Admit: 2024-02-26 | Discharge: 2024-02-26 | Disposition: A | Discharge status: Home / Self Care | Source: Ambulatory Visit | Attending: Adult Health | Admitting: Adult Health

## 2024-02-26 DIAGNOSIS — Z741 Need for assistance with personal care: Secondary | ICD-10-CM | POA: Diagnosis not present

## 2024-02-26 NOTE — Progress Notes (Signed)
  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    The patient was not available for call today. Voicemail has not been set up yet.

## 2024-02-26 NOTE — Progress Notes (Signed)
 Complex Care Management Note Care Guide Note  02/26/2024 Name: Erika Lopez MRN: [id number] DOB: [birth year]   Complex Care Management Outreach Attempts: A second unsuccessful outreach was attempted today to offer the patient with information about available complex care management services.  Follow Up Plan:  Additional outreach attempts will be made to offer the patient complex care management information and services.   Encounter Outcome:  No Answer  Leotis Rase [facility], [facility] Guide  Direct Dial: [phone]  Fax [phone]

## 2024-02-27 ENCOUNTER — Encounter: Payer: Self-pay | Admitting: Hematology and Oncology

## 2024-02-27 DIAGNOSIS — Z741 Need for assistance with personal care: Secondary | ICD-10-CM | POA: Diagnosis not present

## 2024-02-28 ENCOUNTER — Other Ambulatory Visit: Payer: Self-pay

## 2024-02-28 DIAGNOSIS — Z741 Need for assistance with personal care: Secondary | ICD-10-CM | POA: Diagnosis not present

## 2024-02-28 NOTE — Progress Notes (Signed)
 Specialty Pharmacy Refill Coordination Note  Erika Lopez is a 58 y.o. female contacted today regarding refills of specialty medication(s) Capecitabine  (XELODA )   Patient requested Delivery   Delivery date: 03/01/24   Verified address: [address]  [place]   Medication will be filled on 02/29/24.

## 2024-02-29 DIAGNOSIS — Z741 Need for assistance with personal care: Secondary | ICD-10-CM | POA: Diagnosis not present

## 2024-03-01 DIAGNOSIS — Z741 Need for assistance with personal care: Secondary | ICD-10-CM | POA: Diagnosis not present

## 2024-03-02 DIAGNOSIS — Z741 Need for assistance with personal care: Secondary | ICD-10-CM | POA: Diagnosis not present

## 2024-03-03 DIAGNOSIS — Z741 Need for assistance with personal care: Secondary | ICD-10-CM | POA: Diagnosis not present

## 2024-03-04 DIAGNOSIS — Z741 Need for assistance with personal care: Secondary | ICD-10-CM | POA: Diagnosis not present

## 2024-03-05 ENCOUNTER — Encounter: Payer: Self-pay | Admitting: *Deleted

## 2024-03-05 DIAGNOSIS — Z741 Need for assistance with personal care: Secondary | ICD-10-CM | POA: Diagnosis not present

## 2024-03-05 NOTE — Progress Notes (Signed)
 Complex Care Management Note  Care Guide Note 03/05/2024 Name: Erika Lopez MRN: [id number] DOB: 07/02/1966  Erika Lopez is a 58 y.o. year old female who sees [name], NP for primary care. I reached out to Carlota Ballman by phone today to offer complex care management services.  [name] was given information about Complex Care Management services today including:   The Complex Care Management services include support from the care team which includes your Nurse Care Manager, Clinical Social Worker, or Pharmacist.  The Complex Care Management team is here to help remove barriers to the health concerns and goals most important to you. Complex Care Management services are voluntary, and the patient may decline or stop services at any time by request to their care team member.   Complex Care Management Consent Status: Patient agreed to services and verbal consent obtained.   Follow up plan:  Telephone appointment with complex care management team member scheduled for:  03/08/24 @ 10 AM  Encounter Outcome:  Patient Scheduled  Leotis Rase [facility], [facility] Guide  Direct Dial: [phone]  Fax [phone]

## 2024-03-05 NOTE — Progress Notes (Signed)
 Received fax from [facility] requesting independent assessment for personal care services attestation of medical need form completed by MD.  Form completed and successfully faxed to [phone].

## 2024-03-06 DIAGNOSIS — Z741 Need for assistance with personal care: Secondary | ICD-10-CM | POA: Diagnosis not present

## 2024-03-07 DIAGNOSIS — Z741 Need for assistance with personal care: Secondary | ICD-10-CM | POA: Diagnosis not present

## 2024-03-08 ENCOUNTER — Telehealth: Payer: Self-pay

## 2024-03-08 ENCOUNTER — Other Ambulatory Visit: Payer: Self-pay

## 2024-03-08 VITALS — BP 134/77 | Wt 175.0 lb

## 2024-03-08 DIAGNOSIS — Z741 Need for assistance with personal care: Secondary | ICD-10-CM | POA: Diagnosis not present

## 2024-03-08 DIAGNOSIS — Z5986 Financial insecurity: Secondary | ICD-10-CM

## 2024-03-08 NOTE — Telephone Encounter (Signed)
 Pt called with concerns stating that last night she noticed blood pooling at the bottom of her bilat feet. She states, it looks like pooling like when someone dies. The bottom of my feet are black. She reports they are tender. Advised pt to go to ED for evaluation. She is agreeable to go to [facility] ED.

## 2024-03-08 NOTE — Patient Instructions (Signed)
 Visit Information  Erika Lopez was given information about Medicaid Managed Care team care coordination services as a part of their [facility] Community Plan Medicaid benefit.   If you would like to schedule transportation through your [facility], please call the following number at least 2 days in advance of your appointment: [phone]   Rides for urgent appointments can also be made after hours by calling Member Services.  Call the Behavioral Health Crisis Line at [phone], at any time, 24 hours a day, 7 days a week. If you are in danger or need immediate medical attention call 911.   Erika Lopez - following are the goals we discussed in your visit today:   Goals Addressed             This Visit's Progress    VBCI RN Care Plan       Problems:  Chronic Disease Management support and education needs related to breast cancer   Goal: Over the next 90 days the Patient will attend all scheduled medical appointments: with providers as evidenced by provider encounter in EPIC for each scheduled appointment         continue to work with RN Care Manager and/or Social Worker to address care management and care coordination needs related to breast cancer as evidenced by adherence to care management team scheduled appointments     demonstrate Ongoing health management independence as evidenced by following providers care plan         demonstrate understanding of rationale for each prescribed medication as evidenced by verbalizing use of medications during review    take all medications exactly as prescribed and will call provider for medication related questions as evidenced by communication with Provider regarding medication questions or concern     work with Child psychotherapist to address Financial constraints related to breast cancer and Limited access to food related to the management of breast cancer as evidenced by review of electronic medical record and patient or  social worker report      Interventions:   Oncology: Assessment of understanding of oncology diagnosis:  Assessed patient understanding of cancer diagnosis and recommended treatment plan Reviewed upcoming provider appointments and treatment appointments Assessed available transportation to appointments and treatments. Has consistent/reliable transportation: Yes Assessed support system. Has consistent/reliable family or other support: Yes PHQ2/PHQ9 performed Nutrition assessment performed  Patient Self-Care Activities:  Attend all scheduled provider appointments Call pharmacy for medication refills 3-7 days in advance of running out of medications Call provider office for new concerns or questions  Notify RN Care Manager of TOC call rescheduling needs Take medications as prescribed   Work with the social worker to address care coordination needs and will continue to work with the clinical team to address health care and disease management related needs  Plan:  Telephone follow up appointment with care management team member scheduled for:  03-29-2024 at 10:00 AM              Please see education materials related to breast cancer provided by MyChart link.  Patient verbalizes understanding of instructions and care plan provided today and agrees to view in MyChart. Active MyChart status and patient understanding of how to access instructions and care plan via MyChart confirmed with patient.     Telephone follow up appointment with Managed Medicaid care management team member scheduled for:03-29-2024 at 10:00 AM   Hendricks Her RN, BSN  [place] VBCI-Population Health RN Case Information systems manager  [phone]   Following is a copy of your plan of care:  There are no care plans that you recently modified to display for this patient.

## 2024-03-08 NOTE — Patient Outreach (Signed)
 Complex Care Management   Visit Note  03/08/2024  Name:  Erika Lopez MRN: [id number] DOB: 1965/10/21  Situation: Referral received for Complex Care Management related to breast cancer I obtained verbal consent from Patient.  Visit completed with patient   on the phone  Background:   Past Medical History:  Diagnosis Date   Arthritis    Back pain    Cancer (HCC)    DDD (degenerative disc disease), lumbosacral    DDD (degenerative disc disease), thoracic    DDD (degenerative disc disease), thoracolumbar    Dysrhythmia 2024   per pt high HR due to cancer tx    Assessment: Patient Reported Symptoms:  Cognitive Cognitive Status: Struggling with memory recall (Since chemotherapy)   Health Maintenance Behaviors: Healthy diet (fruit and leafy vegetables) Healing Pattern: Slow Health Facilitated by: Healthy diet, Prayer/meditation, Rest, Pain control  Neurological Neurological Review of Symptoms: Numbness (pinky finger) Neurological Management Strategies: Coping strategies, Routine screening Neurological Self-Management Outcome: 4 (good)  HEENT HEENT Symptoms Reported: Mouth dryness (peppermints and water help) HEENT Management Strategies: Coping strategies HEENT Self-Management Outcome: 4 (good)    Cardiovascular Cardiovascular Symptoms Reported: Lightheadness, Chest pain or discomfort (Patient states the discomfort is every now and then) Does patient have uncontrolled Hypertension?: No Cardiovascular Management Strategies: Medication therapy, Coping strategies Weight: 175 lb (79.4 kg) (July 31 in Epic) Cardiovascular Self-Management Outcome: 4 (good)  Respiratory Respiratory Symptoms Reported: No symptoms reported Respiratory Management Strategies: Routine screening, Coping strategies  Endocrine Endocrine Symptoms Reported: No symptoms reported Is patient diabetic?: No Endocrine Self-Management Outcome: 4 (good)  Gastrointestinal Gastrointestinal Symptoms Reported:  Diarrhea Additional Gastrointestinal Details: Pateint states chemotherapy side effect Gastrointestinal Management Strategies: Coping strategies Gastrointestinal Self-Management Outcome: 4 (good)    Genitourinary Genitourinary Symptoms Reported: No symptoms reported Genitourinary Management Strategies: Coping strategies Genitourinary Self-Management Outcome: 4 (good)  Integumentary Integumentary Symptoms Reported: Sweating Additional Integumentary Details: Pateint states sweats alot Skin Management Strategies: Coping strategies Skin Self-Management Outcome: 4 (good)  Musculoskeletal Musculoskelatal Symptoms Reviewed: Back pain, Weakness Additional Musculoskeletal Details: lower back and side Musculoskeletal Management Strategies: Coping strategies, Medication therapy Musculoskeletal Self-Management Outcome: 4 (good) Falls in the past year?: Yes Number of falls in past year: 1 or less Was there an injury with Fall?: Yes (Broke wrist during a fall end of may 2025  Lost balance) Fall Risk Category Calculator: 2 Patient Fall Risk Level: Moderate Fall Risk Patient at Risk for Falls Due to: History of fall(s) Fall risk Follow up: Falls evaluation completed, Education provided, Falls prevention discussed  Psychosocial Psychosocial Symptoms Reported: Anxiety - if selected complete GAD, Depression - if selected complete PHQ 2-9, Sadness - if selected complete PHQ 2-9 Behavioral Management Strategies: Coping strategies, Support system Behavioral Health Self-Management Outcome: 4 (good) Major Change/Loss/Stressor/Fears (CP): Medical condition, self Techniques to Cope with Loss/Stress/Change: Spiritual practice(s) Quality of Family Relationships: helpful, involved, supportive Do you feel physically threatened by others?: No      03/08/2024   10:57 AM  Depression screen PHQ 2/9  Decreased Interest 3  Down, Depressed, Hopeless 2  PHQ - 2 Score 5  Altered sleeping 2  Tired, decreased energy 2   Change in appetite 1  Feeling bad or failure about yourself  2  Trouble concentrating 1  Moving slowly or fidgety/restless 1  Suicidal thoughts 0  PHQ-9 Score 14  Difficult doing work/chores Somewhat difficult    Vitals:   03/08/24 1045  BP: 134/77    Medications Reviewed Today     Reviewed by  Kay Hendricks MATSU, RN (Case Manager) on 03/08/24 at 1029  Med List Status: <None>   Medication Order Taking? Sig Documenting Provider Last Dose Status Informant  capecitabine  (XELODA ) 500 MG tablet [id number] Yes Take 3 tablets (1,500 mg total) by mouth 2 (two) times daily after a meal. 14 days on and 7 days off Gudena, Vinay, MD  Active   diphenhydrAMINE (BENADRYL) 50 MG capsule [id number] Yes Take 50 mg by mouth every 6 (six) hours as needed for allergies or itching. [provider]  Active   gabapentin  (NEURONTIN ) 100 MG capsule [id number] Yes Take 1 capsule (100 mg total) by mouth 3 (three) times daily.  Patient taking differently: Take 100 mg by mouth as needed.   Gudena, Vinay, MD  Active   meloxicam  (MOBIC ) 7.5 MG tablet [id number] Yes Take 1 tablet (7.5 mg total) by mouth daily. Gudena, Vinay, MD  Active   metoprolol  tartrate (LOPRESSOR ) 25 MG tablet [id number] Yes Take 1 tablet (25 mg total) by mouth 2 (two) times daily. Gudena, Vinay, MD  Active   oxyCODONE  (ROXICODONE ) 5 MG immediate release tablet [id number]  Take 1 tablet (5 mg total) by mouth every 6 (six) hours as needed for severe pain (pain score 7-10).  Patient not taking: Reported on 03/08/2024   Gudena, Vinay, MD  Active             Recommendation:   Specialty provider follow-up Call Dr Odean ( patient preference) to discuss toe concern  Referral to: Social Work  Continue Current Plan of Care  Follow Up Plan:   Telephone follow-up 2 weeks   Hendricks Kay RN, BSN  [place] VBCI-Population Health RN Case Information systems manager [phone]

## 2024-03-09 DIAGNOSIS — Z741 Need for assistance with personal care: Secondary | ICD-10-CM | POA: Diagnosis not present

## 2024-03-10 DIAGNOSIS — Z741 Need for assistance with personal care: Secondary | ICD-10-CM | POA: Diagnosis not present

## 2024-03-11 ENCOUNTER — Other Ambulatory Visit: Payer: Self-pay

## 2024-03-14 ENCOUNTER — Telehealth: Payer: Self-pay

## 2024-03-14 ENCOUNTER — Other Ambulatory Visit (HOSPITAL_COMMUNITY): Payer: Self-pay

## 2024-03-14 DIAGNOSIS — Z741 Need for assistance with personal care: Secondary | ICD-10-CM | POA: Diagnosis not present

## 2024-03-14 NOTE — Progress Notes (Signed)
 Complex Care Management Note Care Guide Note  03/14/2024 Name: Erika Lopez MRN: [id number] DOB: 08-Oct-1965   Complex Care Management Outreach Attempts: An unsuccessful telephone outreach was attempted today to offer the patient information about available complex care management services.  Follow Up Plan:  Additional outreach attempts will be made to offer the patient complex care management information and services.   Encounter Outcome:  No Answer  Dreama Lynwood [facility]  [facility], [facility] VBCI Assistant Direct Dial: [phone]  Fax: [phone]

## 2024-03-15 ENCOUNTER — Other Ambulatory Visit: Payer: Self-pay

## 2024-03-15 ENCOUNTER — Other Ambulatory Visit: Payer: Self-pay | Admitting: Pharmacy Technician

## 2024-03-15 DIAGNOSIS — Z741 Need for assistance with personal care: Secondary | ICD-10-CM | POA: Diagnosis not present

## 2024-03-15 NOTE — Progress Notes (Signed)
 Specialty Pharmacy Refill Coordination Note  Erika Lopez is a 58 y.o. female contacted today regarding refills of specialty medication(s) Capecitabine  (XELODA )   Patient requested Delivery   Delivery date: 03/18/24   Verified address: [address]  [place]   Medication will be filled on 03/15/24.

## 2024-03-16 DIAGNOSIS — Z741 Need for assistance with personal care: Secondary | ICD-10-CM | POA: Diagnosis not present

## 2024-03-17 DIAGNOSIS — Z741 Need for assistance with personal care: Secondary | ICD-10-CM | POA: Diagnosis not present

## 2024-03-20 DIAGNOSIS — Z741 Need for assistance with personal care: Secondary | ICD-10-CM | POA: Diagnosis not present

## 2024-03-21 DIAGNOSIS — Z741 Need for assistance with personal care: Secondary | ICD-10-CM | POA: Diagnosis not present

## 2024-03-22 ENCOUNTER — Telehealth: Payer: Self-pay | Admitting: Genetic Counselor

## 2024-03-22 DIAGNOSIS — Z741 Need for assistance with personal care: Secondary | ICD-10-CM | POA: Diagnosis not present

## 2024-03-22 NOTE — Progress Notes (Signed)
 Complex Care Management Note  Care Guide Note 03/22/2024 Name: [name] MRN: [id number] DOB: January 12, 1966  Erika Lopez is a 58 y.o. year old female who sees [name], NP for primary care. I reached out to Mithra Oconnor by phone today to offer complex care management services.  [name] was given information about Complex Care Management services today including:   The Complex Care Management services include support from the care team which includes your Nurse Care Manager, Clinical Social Worker, or Pharmacist.  The Complex Care Management team is here to help remove barriers to the health concerns and goals most important to you. Complex Care Management services are voluntary, and the patient may decline or stop services at any time by request to their care team member.   Complex Care Management Consent Status: Patient agreed to services and verbal consent obtained.   Follow up plan:  Telephone appointment with complex care management team member scheduled for:  04/01/24 at 2:30 p.m.   Encounter Outcome:  Patient Scheduled  Dreama Lynwood [facility]  [facility], [facility] VBCI Assistant Direct Dial: [phone]  Fax: [phone]

## 2024-03-22 NOTE — Telephone Encounter (Signed)
 I messaged Dr. Gudena to let them know that Erika Lopez is recommended to have genetic testing due to their personal history of triple negative breast cancer.   Erika Chay, MS, Erika Lopez Genetic Counselor [email or web address] (P) [phone]

## 2024-03-23 DIAGNOSIS — Z741 Need for assistance with personal care: Secondary | ICD-10-CM | POA: Diagnosis not present

## 2024-03-24 DIAGNOSIS — Z741 Need for assistance with personal care: Secondary | ICD-10-CM | POA: Diagnosis not present

## 2024-03-25 DIAGNOSIS — Z741 Need for assistance with personal care: Secondary | ICD-10-CM | POA: Diagnosis not present

## 2024-03-26 ENCOUNTER — Other Ambulatory Visit: Payer: Self-pay | Admitting: *Deleted

## 2024-03-26 DIAGNOSIS — Z741 Need for assistance with personal care: Secondary | ICD-10-CM | POA: Diagnosis not present

## 2024-03-26 DIAGNOSIS — C50511 Malignant neoplasm of lower-outer quadrant of right female breast: Secondary | ICD-10-CM

## 2024-03-26 NOTE — Progress Notes (Signed)
 Received call from pt requesting MD assistance in Medicaid renewal.  RN transferred pt to social work for assistance.

## 2024-03-27 ENCOUNTER — Inpatient Hospital Stay: Attending: Hematology and Oncology | Admitting: Licensed Clinical Social Worker

## 2024-03-27 DIAGNOSIS — Z741 Need for assistance with personal care: Secondary | ICD-10-CM | POA: Diagnosis not present

## 2024-03-27 DIAGNOSIS — D6481 Anemia due to antineoplastic chemotherapy: Secondary | ICD-10-CM | POA: Insufficient documentation

## 2024-03-27 DIAGNOSIS — Z923 Personal history of irradiation: Secondary | ICD-10-CM | POA: Insufficient documentation

## 2024-03-27 DIAGNOSIS — Z1722 Progesterone receptor negative status: Secondary | ICD-10-CM | POA: Insufficient documentation

## 2024-03-27 DIAGNOSIS — Z171 Estrogen receptor negative status [ER-]: Secondary | ICD-10-CM | POA: Insufficient documentation

## 2024-03-27 DIAGNOSIS — R000 Tachycardia, unspecified: Secondary | ICD-10-CM | POA: Insufficient documentation

## 2024-03-27 DIAGNOSIS — Z1732 Human epidermal growth factor receptor 2 negative status: Secondary | ICD-10-CM | POA: Insufficient documentation

## 2024-03-27 DIAGNOSIS — Z79899 Other long term (current) drug therapy: Secondary | ICD-10-CM | POA: Insufficient documentation

## 2024-03-27 DIAGNOSIS — L299 Pruritus, unspecified: Secondary | ICD-10-CM | POA: Insufficient documentation

## 2024-03-27 DIAGNOSIS — Z9221 Personal history of antineoplastic chemotherapy: Secondary | ICD-10-CM | POA: Insufficient documentation

## 2024-03-27 DIAGNOSIS — C50511 Malignant neoplasm of lower-outer quadrant of right female breast: Secondary | ICD-10-CM | POA: Insufficient documentation

## 2024-03-27 NOTE — Progress Notes (Signed)
 [facility] Clinical Social Work  Clinical Social Work was referred by Engineer, civil (consulting) for [employer].  Clinical Social Worker contacted patient by phone to offer support and assess for needs.    Pt has received paperwork about Medicaid renewal paperwork. CSW sent question to [facility] with [facility] program who confirmed the following, [DSS] have renewed it. It is now Adult Medicaid Expansion, same number, still medicaid with medical, dental, vision. CSW shared the above with patient who voiced understanding.  Pt asked about a form from her home aide agency. CSW informed pt, per chart, that the form was received and faxed back by the RN on 03/05/2024.  Pt asked about any other grants. Unfortunately, most are closed and the one that is open, pt has already received within the last 12 months, so is not eligible.   Follow Up Plan:  Patient will contact CSW with any support or resource needs    Erika Starrett E Cashis Rill, LCSW  Clinical Social Worker [facility]        Patient is participating in a Managed Medicaid Plan:  Yes

## 2024-03-28 DIAGNOSIS — Z741 Need for assistance with personal care: Secondary | ICD-10-CM | POA: Diagnosis not present

## 2024-03-29 ENCOUNTER — Other Ambulatory Visit (HOSPITAL_COMMUNITY): Payer: Self-pay

## 2024-03-29 ENCOUNTER — Other Ambulatory Visit: Payer: Self-pay

## 2024-03-29 DIAGNOSIS — Z741 Need for assistance with personal care: Secondary | ICD-10-CM | POA: Diagnosis not present

## 2024-03-29 NOTE — Patient Instructions (Signed)
 Visit Information  [name] was given information about Medicaid Managed Care team care coordination services as a part of their [facility] Community Plan Medicaid benefit.   If you would like to schedule transportation through your [facility], please call the following number at least 2 days in advance of your appointment: [phone]   Rides for urgent appointments can also be made after hours by calling Member Services.  Call the Behavioral Health Crisis Line at [phone], at any time, 24 hours a day, 7 days a week. If you are in danger or need immediate medical attention call 911.   [name] - following are the goals we discussed in your visit today:   Goals Addressed             This Visit's Progress    VBCI RN Care Plan   On track    Problems:  Chronic Disease Management support and education needs related to breast cancer   Goal: Over the next 90 days the Patient will attend all scheduled medical appointments: with providers as evidenced by provider encounter in [facility] for each scheduled appointment         continue to work with RN Care Manager and/or Social Worker to address care management and care coordination needs related to breast cancer as evidenced by adherence to care management team scheduled appointments     demonstrate Ongoing health management independence as evidenced by following providers care plan         demonstrate understanding of rationale for each prescribed medication as evidenced by verbalizing use of medications during review    take all medications exactly as prescribed and will call provider for medication related questions as evidenced by communication with Provider regarding medication questions or concern     work with Child psychotherapist to address Financial constraints related to breast cancer and Limited access to food related to the management of breast cancer as evidenced by review of electronic medical record and  patient or social worker report      Interventions:   Oncology: Assessment of understanding of oncology diagnosis:  Assessed patient understanding of cancer diagnosis and recommended treatment plan Reviewed upcoming provider appointments and treatment appointments Assessed available transportation to appointments and treatments. Has consistent/reliable transportation: Yes Assessed support system. Has consistent/reliable family or other support: Yes PHQ2/PHQ9 performed Nutrition assessment performed  Patient Self-Care Activities:  Attend all scheduled provider appointments Call pharmacy for medication refills 3-7 days in advance of running out of medications Call provider office for new concerns or questions  Notify RN Care Manager of TOC call rescheduling needs Take medications as prescribed   Work with the social worker to address care coordination needs and will continue to work with the clinical team to address health care and disease management related needs Patient will ask Mother for assistance acquiring a BP cuff for daily BP readings   Plan:  Telephone follow up appointment with care management team member scheduled for:  04-12-2024 at 9:00 AM              Please see education materials related to blood pressure monitoring  provided by MyChart link.  Patient verbalizes understanding of instructions and care plan provided today and agrees to view in MyChart. Active MyChart status and patient understanding of how to access instructions and care plan via MyChart confirmed with patient.     Telephone follow up appointment with Managed Medicaid care management team member scheduled for:04-12-2024 at 9:00 AM  Hendricks Her RN, BSN  [place] VBCI-Population Health RN Case Manager   Direct [phone]   Following is a copy of your plan of care:  There are no care plans that you recently modified to display for this patient.

## 2024-03-30 DIAGNOSIS — Z741 Need for assistance with personal care: Secondary | ICD-10-CM | POA: Diagnosis not present

## 2024-03-31 DIAGNOSIS — Z741 Need for assistance with personal care: Secondary | ICD-10-CM | POA: Diagnosis not present

## 2024-04-01 ENCOUNTER — Telehealth: Payer: Self-pay | Admitting: Licensed Clinical Social Worker

## 2024-04-02 ENCOUNTER — Other Ambulatory Visit: Payer: Self-pay

## 2024-04-02 DIAGNOSIS — Z741 Need for assistance with personal care: Secondary | ICD-10-CM | POA: Diagnosis not present

## 2024-04-02 NOTE — Progress Notes (Signed)
 Specialty Pharmacy Refill Coordination Note  Erika Lopez is a 58 y.o. female contacted today regarding refills of specialty medication(s) Capecitabine  (XELODA )   Patient requested Delivery   Delivery date: 04/08/24   Verified address: [address]  [place]   Medication will be filled on 04/05/24.

## 2024-04-02 NOTE — Progress Notes (Signed)
 Clinical Intervention Note  Clinical Intervention Notes: Patient requested to speak to pharmacist in regards to side effects from Xeloda . Patient reports gradual change in skin on hands and feet since start of treatment. She states that skin is darkening and has become significant on both hands and feet but she is not having other symptoms such as dry skin, burning, peeling etc. She is experiencing neuropathy for which she is taking gabapentin . She is not applying any topical products to the affected areas at this time. She states that she did contact her provider office about this and was unclear on their response. I asked if she was going to be seen in office for this issue and she was unsure. I reminded her of next appointment with Dr. Odean on 9/24 and asked if she was told to discuss at this appointment. She said she thinks she was. She also stated she was informed to proceed to ED. I asked if she had additional symptoms in one or more of the affected areas that would require emergency attention such as skin temperature changes, loss of feeling, or open wounds/sign of infection. She states that she does not. Informed patient that if she notices any of these or other symptoms or extreme, rapid worsening to proceed to ED. Patient is in agreement. After call concluded noticed telephone encounter from 8/15 with Leita Delores CRUMBLE where patient described as blood pooling. Patient did not describe to me in this way. Notifying John/oncology team in case further follow up is needed.   Clinical Intervention Outcomes: Prevention of an adverse drug event   Delon CHRISTELLA Brow Specialty Pharmacist

## 2024-04-03 ENCOUNTER — Telehealth: Payer: Self-pay

## 2024-04-03 ENCOUNTER — Other Ambulatory Visit: Payer: Self-pay | Admitting: Hematology and Oncology

## 2024-04-03 ENCOUNTER — Other Ambulatory Visit: Payer: Self-pay | Admitting: *Deleted

## 2024-04-03 ENCOUNTER — Other Ambulatory Visit (HOSPITAL_BASED_OUTPATIENT_CLINIC_OR_DEPARTMENT_OTHER): Payer: Self-pay

## 2024-04-03 DIAGNOSIS — Z741 Need for assistance with personal care: Secondary | ICD-10-CM | POA: Diagnosis not present

## 2024-04-03 MED ORDER — GABAPENTIN 100 MG PO CAPS
100.0000 mg | ORAL_CAPSULE | Freq: Three times a day (TID) | ORAL | 0 refills | Status: AC
  Filled 2024-04-03: qty 90, 30d supply, fill #0

## 2024-04-03 MED ORDER — OXYCODONE HCL 5 MG PO TABS: 5.0000 mg | ORAL_TABLET | Freq: Four times a day (QID) | ORAL | 0 refills | Status: AC | PRN

## 2024-04-03 NOTE — Progress Notes (Signed)
 Complex Care Management Care Guide Note  04/03/2024 Name: Raigen Jagielski MRN: [id number] DOB: 1965-12-07  Kree Armato is a 58 y.o. year old female who is a primary care patient of Yacopino, Jessica L, NP and is actively engaged with the care management team. I reached out to Samaia Mach by phone today to assist with re-scheduling  with the Licensed Clinical Child psychotherapist.  Follow up plan: Telephone appointment with complex care management team member scheduled for:  04/10/24 at 10:00 a.m.   Dreama Lynwood [facility]  [facility], [facility] VBCI Assistant Direct Dial: [phone]  Fax: [phone]

## 2024-04-03 NOTE — Telephone Encounter (Signed)
 Received call from pt requesting refill on Oxycodone  stating she is experiencing increase pain from neuropathy as well as bilateral breast discomfort.  Pt states she is unable to come into the office at this time and is scheduled for f/u with MD next week and will discuss at that time.  MD notified to refill oxy.

## 2024-04-04 ENCOUNTER — Other Ambulatory Visit: Payer: Self-pay

## 2024-04-05 ENCOUNTER — Other Ambulatory Visit (HOSPITAL_COMMUNITY): Payer: Self-pay

## 2024-04-05 DIAGNOSIS — Z741 Need for assistance with personal care: Secondary | ICD-10-CM | POA: Diagnosis not present

## 2024-04-05 NOTE — Progress Notes (Addendum)
 I attempted to reach the patient. Patient does not have a voicemail. Patient now has a $4 copayment. Credit card on file declined.

## 2024-04-05 NOTE — Progress Notes (Signed)
 UPDATED PAYMENT INFORMATION NEEDED. Patient now has a $4 copay. Credit card on file declined. 9.12 fill has been charged to AR account.

## 2024-04-06 DIAGNOSIS — Z741 Need for assistance with personal care: Secondary | ICD-10-CM | POA: Diagnosis not present

## 2024-04-07 DIAGNOSIS — Z741 Need for assistance with personal care: Secondary | ICD-10-CM | POA: Diagnosis not present

## 2024-04-08 DIAGNOSIS — Z741 Need for assistance with personal care: Secondary | ICD-10-CM | POA: Diagnosis not present

## 2024-04-09 DIAGNOSIS — Z741 Need for assistance with personal care: Secondary | ICD-10-CM | POA: Diagnosis not present

## 2024-04-10 ENCOUNTER — Other Ambulatory Visit: Payer: Self-pay | Admitting: Licensed Clinical Social Worker

## 2024-04-10 DIAGNOSIS — Z741 Need for assistance with personal care: Secondary | ICD-10-CM | POA: Diagnosis not present

## 2024-04-10 DIAGNOSIS — C50511 Malignant neoplasm of lower-outer quadrant of right female breast: Secondary | ICD-10-CM

## 2024-04-10 NOTE — Patient Instructions (Signed)
 Visit Information  Erika Lopez was given information about Medicaid Managed Care team care coordination services as a part of their [facility] Community Plan Medicaid benefit.   If you would like to schedule transportation through your [facility], please call the following number at least 2 days in advance of your appointment: [phone]   Rides for urgent appointments can also be made after hours by calling Member Services.  Call the Behavioral Health Crisis Line at [phone], at any time, 24 hours a day, 7 days a week. If you are in danger or need immediate medical attention call 911.   Erika Lopez - following are the goals we discussed in your visit today:   Goals Addressed             This Visit's Progress    VBCI Social Work Care Plan       Problems:   Disease Management support and education needs related to Anxiety with Excessive Worry, and Depression: depressed mood  CSW Clinical Goal(s):   Over the next 6 weeks the Patient will demonstrate a reduction in symptoms related to Anxiety with Excessive Worry, Depression: depressed mood .  Interventions:  Mental Health:  Evaluation of current treatment plan related to Anxiety with Excessive Worry, Active listening / Reflection utilized Behavioral Activation reviewed Depression screen reviewed Mindfulness or Relaxation training provided Motivational Interviewing employed Provided general psycho-education for mental health needs  Patient Goals/Self-Care Activities:  Continue taking your medication as prescribed.   Monitor for changes in mood and utilize coping skills as needed  Plan:   Telephone follow up appointment with care management team member scheduled for:  05/01/2024        Patient verbalizes understanding of instructions and care plan provided today and agrees to view in MyChart. Active MyChart status and patient understanding of how to access instructions and care plan via  MyChart confirmed with patient.     Social Worker will reach out to pt on 05/01/2024.   Alm Armor, LCSW [facility], Population Health Licensed Clinical Social Worker Care Coordinator [phone]   Following is a copy of your plan of care:  There are no care plans that you recently modified to display for this patient.

## 2024-04-10 NOTE — Patient Outreach (Signed)
 Complex Care Management   Visit Note  04/10/2024  Name:  Erika Lopez MRN: [id number] DOB: May 11, 1966  Situation: Referral received for Complex Care Management related to Mental/Behavioral Health diagnosis Anxiety I obtained verbal consent from Patient.  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 tx    Assessment: Patient Reported Symptoms:  Cognitive Cognitive Status: Struggling with memory recall   Health Maintenance Behaviors: Healthy diet Healing Pattern: Slow Health Facilitated by: Healthy diet, Prayer/meditation, Pain control, Rest  Neurological Neurological Review of Symptoms: Headaches, Other: [place] Neurological Symptoms/Conditions [RPT]: Tingling and pain in hands and feet Neurological Management Strategies: Routine screening, Coping strategies, Medication therapy Neurological Self-Management Outcome: 4 (good)  HEENT HEENT Symptoms Reported: No symptoms reported      Cardiovascular Cardiovascular Symptoms Reported: Chest pain or discomfort Does patient have uncontrolled Hypertension?: No Cardiovascular Management Strategies: Medication therapy, Coping strategies Cardiovascular Self-Management Outcome: 4 (good)  Respiratory Respiratory Symptoms Reported: No symptoms reported    Endocrine Endocrine Symptoms Reported: No symptoms reported Is patient diabetic?: No Endocrine Self-Management Outcome: 4 (good)  Gastrointestinal Gastrointestinal Symptoms Reported: Diarrhea Gastrointestinal Management Strategies: Coping strategies    Genitourinary Genitourinary Symptoms Reported: No symptoms reported    Integumentary Integumentary Symptoms Reported: Not assessed    Musculoskeletal           Psychosocial Psychosocial Symptoms Reported: Depression - if selected complete PHQ 2-9, Anxiety - if selected complete GAD Additional Psychological Details: Ongoing stress with cancer treatments and managing medical appointments. Pt is having to advocate for herself - dealt with anxiety and depression prior to cancer treatments. Difficulty sleeping. Behavioral Management Strategies: Coping strategies, Adequate rest Behavioral Health Self-Management Outcome: 3 (uncertain) Major Change/Loss/Stressor/Fears (CP): Medical condition, self Techniques to Cope with Loss/Stress/Change: Spiritual practice(s) Quality of Family Relationships: helpful, involved Do you feel physically threatened by others?: No    04/10/2024    PHQ2-9 Depression Screening   Little interest or pleasure in doing things Several days  Feeling down, depressed, or hopeless More than half the days  PHQ-2 - Total Score 3  Trouble falling or staying asleep, or sleeping too much Several days  Feeling tired or having little energy More than half the days  Poor appetite or overeating  Several days  Feeling bad about yourself - or that you are a failure or have let yourself or your family down    Trouble concentrating on things, such as reading the newspaper or watching television Nearly every day  Moving or speaking so slowly that other people could have noticed.  Or the opposite - being so fidgety or restless that you have been moving around a lot more than usual Several days  Thoughts that you would be better off dead, or hurting yourself in some way Not at all  PHQ2-9 Total Score 11  If you checked off any problems, how difficult have these problems made it for you to do your work, take care of things at home, or get along with other people Somewhat difficult  Depression Interventions/Treatment Medication    There were no vitals filed for this visit.  Medications Reviewed Today     Reviewed by Kit,  Alm DELENA HUGHS  (Social Worker) on 04/10/24 at 1026  Med List Status: <None>   Medication Order Taking? Sig Documenting Provider Last Dose Status Informant  capecitabine  (XELODA ) 500 MG tablet [id number] Yes Take 3 tablets (1,500 mg total) by mouth 2 (two) times daily after a meal. 14 days on and 7 days off Gudena, Vinay, MD  Active   diphenhydrAMINE (BENADRYL) 50 MG capsule [id number] Yes Take 50 mg by mouth every 6 (six) hours as needed for allergies or itching. [provider]  Active   gabapentin  (NEURONTIN ) 100 MG capsule [id number] Yes Take 1 capsule (100 mg total) by mouth 3 (three) times daily. Gudena, Vinay, MD  Active   meloxicam  (MOBIC ) 7.5 MG tablet [id number] Yes Take 1 tablet (7.5 mg total) by mouth daily. Gudena, Vinay, MD  Active   metoprolol  tartrate (LOPRESSOR ) 25 MG tablet [id number] Yes Take 1 tablet (25 mg total) by mouth 2 (two) times daily. Gudena, Vinay, MD  Active   oxyCODONE  (ROXICODONE ) 5 MG immediate release tablet [id number]  Take 1 tablet (5 mg total) by mouth every 6 (six) hours as needed for severe pain (pain score 7-10). Gudena, Vinay, MD  Active             Recommendation:   Continue Current Plan of Care  Follow Up Plan:   Telephone follow up appointment date/time:  05/01/2024  Alm Armor, LCSW [place], [facility] Licensed Clinical Social Worker Care Coordinator [phone]

## 2024-04-11 ENCOUNTER — Telehealth: Payer: Self-pay

## 2024-04-11 NOTE — Progress Notes (Signed)
   Telephone encounter was:  Successful.  Complex Care Management Note Care Guide Note  04/11/2024 Name: Erika Lopez MRN: [id number] DOB: Mar 07, 1966  [name] is a 58 y.o. year old female who is a primary care patient of [name], NP . The community resource team was consulted for assistance with Food Insecurity, Financial Difficulties related to financial strain, and Utility  SDOH screenings and interventions completed:  Yes  Social Drivers of Health From This Encounter   Food Insecurity: Food Insecurity Present (04/11/2024)   Hunger Vital Sign    Worried About Running Out of Food in the Last Year: Often true    Ran Out of Food in the Last Year: Often true  Housing: High Risk (04/11/2024)   Housing Stability Vital Sign    Unable to Pay for Housing in the Last Year: Yes    Number of Times Moved in the Last Year: 0    Homeless in the Last Year: No  Financial Resource Strain: High Risk (04/11/2024)   Overall Financial Resource Strain (CARDIA)    Difficulty of Paying Living Expenses: Very hard  Utilities: At Risk (04/11/2024)   Utilities    Threatened with loss of utilities: Yes    SDOH Interventions Today    Flowsheet Row Most Recent Value  SDOH Interventions   Food Insecurity Interventions Community Resources Provided, [employer] Resources Referral  Housing Interventions Community Resources Provided, [employer] Resource Referral  Utilities Interventions FindHelp Community Resource Referral, [employer] Resources Provided  Financial Strain Interventions Community Resources Provided, [employer] Referral     Care guide performed the following interventions: Patient provided with information about care guide support team and interviewed to confirm resource needs.Email: [email or web address] Pt is having financial strain and requested resouces for [facility] for [employer] and Utility to be mailed and emailed to her   Follow Up  Plan:  No further follow up planned at this time. The patient has been provided with needed resources.  Encounter Outcome:  Patient Visit Completed   Jon Colt [facility]  [facility] Guide, Phone: [phone] Fax: [phone] Website: [email or web address]

## 2024-04-12 ENCOUNTER — Telehealth: Payer: Self-pay

## 2024-04-12 ENCOUNTER — Other Ambulatory Visit: Payer: Self-pay

## 2024-04-12 DIAGNOSIS — Z1211 Encounter for screening for malignant neoplasm of colon: Secondary | ICD-10-CM

## 2024-04-12 DIAGNOSIS — Z741 Need for assistance with personal care: Secondary | ICD-10-CM | POA: Diagnosis not present

## 2024-04-12 NOTE — Addendum Note (Signed)
 Addended by: ESTELLE GILLIS D on: 04/12/2024 02:15 PM   Modules accepted: Orders

## 2024-04-12 NOTE — Telephone Encounter (Signed)
 Notified the Pt  regarding her forms , being completed, faxed, and confirmation received. Pt Request her copy to be mailed. No questions or concerns to be noted at this time.

## 2024-04-12 NOTE — Patient Instructions (Signed)
 Visit Information  Ms. Mcilrath was given information about Medicaid Managed Care team care coordination services as a part of their [facility] Community Plan Medicaid benefit.   If you would like to schedule transportation through your [facility], please call the following number at least 2 days in advance of your appointment: [phone]   Rides for urgent appointments can also be made after hours by calling Member Services.  Call the Behavioral Health Crisis Line at [phone], at any time, 24 hours a day, 7 days a week. If you are in danger or need immediate medical attention call 911.  Please see education materials related to breast cancer management provided as print materials.   The patient verbalized understanding of instructions, educational materials, and care plan provided today and agreed to receive a mailed copy of patient instructions, educational materials, and care plan.   Face to Face appointment with Managed Medicaid care management team member scheduled for: 05-13-2024 at 10:00 AM   Hendricks Her RN, BSN  [place] VBCI-Population Health RN Case Manager   Direct [phone]   Following is a copy of your plan of care:  There are no care plans that you recently modified to display for this patient.

## 2024-04-12 NOTE — Telephone Encounter (Signed)
 Copied from CRM [id number]. Topic: Clinical - Request for Lab/Test Order >> Apr 12, 2024  9:46 AM Mia F wrote: Reason for CRM: Pt is looking to get a cologaurd test. Would like to know if this can be sent to her.   Please call pt to contact

## 2024-04-13 DIAGNOSIS — Z741 Need for assistance with personal care: Secondary | ICD-10-CM | POA: Diagnosis not present

## 2024-04-14 DIAGNOSIS — Z741 Need for assistance with personal care: Secondary | ICD-10-CM | POA: Diagnosis not present

## 2024-04-15 DIAGNOSIS — Z741 Need for assistance with personal care: Secondary | ICD-10-CM | POA: Diagnosis not present

## 2024-04-16 DIAGNOSIS — Z741 Need for assistance with personal care: Secondary | ICD-10-CM | POA: Diagnosis not present

## 2024-04-17 ENCOUNTER — Inpatient Hospital Stay (HOSPITAL_BASED_OUTPATIENT_CLINIC_OR_DEPARTMENT_OTHER): Admitting: Hematology and Oncology

## 2024-04-17 ENCOUNTER — Other Ambulatory Visit: Payer: Self-pay

## 2024-04-17 ENCOUNTER — Other Ambulatory Visit (HOSPITAL_BASED_OUTPATIENT_CLINIC_OR_DEPARTMENT_OTHER): Payer: Self-pay

## 2024-04-17 ENCOUNTER — Inpatient Hospital Stay

## 2024-04-17 VITALS — BP 118/72 | HR 100 | Temp 98.1°F | Resp 20 | Ht >= 80 in | Wt 184.6 lb

## 2024-04-17 DIAGNOSIS — Z171 Estrogen receptor negative status [ER-]: Secondary | ICD-10-CM | POA: Diagnosis not present

## 2024-04-17 DIAGNOSIS — R000 Tachycardia, unspecified: Secondary | ICD-10-CM | POA: Diagnosis not present

## 2024-04-17 DIAGNOSIS — Z923 Personal history of irradiation: Secondary | ICD-10-CM | POA: Diagnosis not present

## 2024-04-17 DIAGNOSIS — C50511 Malignant neoplasm of lower-outer quadrant of right female breast: Secondary | ICD-10-CM | POA: Diagnosis not present

## 2024-04-17 DIAGNOSIS — Z1732 Human epidermal growth factor receptor 2 negative status: Secondary | ICD-10-CM | POA: Diagnosis not present

## 2024-04-17 DIAGNOSIS — L299 Pruritus, unspecified: Secondary | ICD-10-CM | POA: Diagnosis not present

## 2024-04-17 DIAGNOSIS — Z79899 Other long term (current) drug therapy: Secondary | ICD-10-CM | POA: Diagnosis not present

## 2024-04-17 DIAGNOSIS — D6481 Anemia due to antineoplastic chemotherapy: Secondary | ICD-10-CM | POA: Diagnosis not present

## 2024-04-17 DIAGNOSIS — Z741 Need for assistance with personal care: Secondary | ICD-10-CM | POA: Diagnosis not present

## 2024-04-17 DIAGNOSIS — Z1722 Progesterone receptor negative status: Secondary | ICD-10-CM | POA: Diagnosis not present

## 2024-04-17 DIAGNOSIS — Z9221 Personal history of antineoplastic chemotherapy: Secondary | ICD-10-CM | POA: Diagnosis not present

## 2024-04-17 LAB — CMP (CANCER CENTER ONLY)
ALT: 14 U/L (ref 0–44)
AST: 14 U/L — ABNORMAL LOW (ref 15–41)
Albumin: 4.3 g/dL (ref 3.5–5.0)
Alkaline Phosphatase: 100 U/L (ref 38–126)
Anion gap: 6 (ref 5–15)
BUN: 10 mg/dL (ref 6–20)
CO2: 29 mmol/L (ref 22–32)
Calcium: 9.3 mg/dL (ref 8.9–10.3)
Chloride: 106 mmol/L (ref 98–111)
Creatinine: 0.67 mg/dL (ref 0.44–1.00)
GFR, Estimated: >60 mL/min (ref >60)
Glucose, Bld: 106 mg/dL — ABNORMAL HIGH (ref 70–99)
Potassium: 4 mmol/L (ref 3.5–5.1)
Sodium: 141 mmol/L (ref 135–145)
Total Bilirubin: 0.7 mg/dL (ref 0.0–1.2)
Total Protein: 7.1 g/dL (ref 6.5–8.1)

## 2024-04-17 LAB — CBC WITH DIFFERENTIAL (CANCER CENTER ONLY)
Abs Immature Granulocytes: 0.01 K/uL (ref 0.00–0.07)
Basophils Absolute: 0 K/uL (ref 0.0–0.1)
Basophils Relative: 0 %
Eosinophils Absolute: 0.1 K/uL (ref 0.0–0.5)
Eosinophils Relative: 2 %
HCT: 36 % (ref 36.0–46.0)
Hemoglobin: 12.9 g/dL (ref 12.0–15.0)
Immature Granulocytes: 0 %
Lymphocytes Relative: 26 %
Lymphs Abs: 1.3 K/uL (ref 0.7–4.0)
MCH: 31.2 pg (ref 26.0–34.0)
MCHC: 35.8 g/dL (ref 30.0–36.0)
MCV: 87.2 fL (ref 80.0–100.0)
Monocytes Absolute: 0.5 K/uL (ref 0.1–1.0)
Monocytes Relative: 11 %
Neutro Abs: 2.9 K/uL (ref 1.7–7.7)
Neutrophils Relative %: 61 %
Platelet Count: 206 K/uL (ref 150–400)
RBC: 4.13 MIL/uL (ref 3.87–5.11)
RDW: 18.6 % — ABNORMAL HIGH (ref 11.5–15.5)
WBC Count: 4.8 K/uL (ref 4.0–10.5)
nRBC: 0 % (ref 0.0–0.2)

## 2024-04-17 MED ORDER — SILVER SULFADIAZINE 1 % EX CREA
1.0000 | TOPICAL_CREAM | Freq: Every day | CUTANEOUS | 0 refills | Status: AC
  Filled 2024-04-17: qty 50, 34d supply, fill #0

## 2024-04-17 NOTE — Progress Notes (Signed)
 Specialty Pharmacy Refill Coordination Note  Erika Lopez is a 58 y.o. female contacted today regarding refills of specialty medication(s) Capecitabine  (XELODA )   Patient requested Delivery   Delivery date: 04/23/24   Verified address: [address]  [place]   Medication will be filled on 04/22/24.

## 2024-04-17 NOTE — Assessment & Plan Note (Signed)
 02/22/2023: [facility]: Palpable mass, mammogram detected right breast mass 5 cm from the nipple 1.6 cm, separate oval circumscribed hypoechoic mass 7 mm (?  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%, skin satellite nodule biopsy positive   Treatment plan: Neoadjuvant chemotherapy with Taxol carbo Keytruda  every 3 weeks x 4 (completed at [facility]), received first cycle of Adriamycin  Cytoxan  Keytruda  on 08/01/2022: Plan to complete keynote 522 regimen with cycle 2 Adriamycin  Cytoxan  Keytruda  to be given 08/23/2023, followed by Keytruda  maintenance (patient had serious adverse effects and discontinued Keytruda ) 11/07/2023: Right lumpectomy: Grade 3 IDC 1.8 cm, margins negative, negative for lymphovascular or perineural invasion, 0/3 lymph nodes negative Adjuvant radiation therapy started 12/29/2023 -------------------------------------------------------------------------------------------------------------------- Chemotherapy-induced anemia Tachycardia: Prescribed metoprolol  25 mg p.o. twice daily Financial distress   Current Treatment: Capecitabine  Maintenance started 01/12/2024 (1500 mg p.o. twice daily 2 weeks on 1 week off) Capecitabine  Toxicities: None   Return to clinic in 2 months with labs and follow-up

## 2024-04-17 NOTE — Progress Notes (Signed)
 Patient Care Team: [name], NP as PCP - General (Nurse Practitioner) Odean Potts, MD as Consulting Physician (Hematology and Oncology) Tyree Nanetta SAILOR, RN as Oncology Nurse Navigator Curvin Deward MOULD, MD as Consulting Physician (General Surgery) Kay Hendricks MATSU, RN as [facility] Care Management  DIAGNOSIS:  Encounter Diagnosis  Name Primary?   Malignant neoplasm of lower-outer quadrant of right breast of female, estrogen receptor negative (HCC) Yes    SUMMARY OF ONCOLOGIC HISTORY: Oncology History  Malignant neoplasm of lower-outer quadrant of right breast of female, estrogen receptor negative (HCC)  02/22/2023 Initial Diagnosis   [facility]: Palpable mass, mammogram detected right breast mass 5 cm from the nipple 1.6 cm, separate oval circumscribed hypoechoic mass 7 mm (?  Intramammary lymph node: Benign) grouped amorphous calcifications UOQ left breast 7 mm (benign), right axillary lymph node biopsy negative Biopsy: Poorly differentiated IDC ER less than 1%, PR less than 1%, HER2 0, Ki-67 90%, skin satellite nodule biopsy positive   04/28/2023 -  Neo-Adjuvant Chemotherapy   Neoadjuvant chemotherapy with Taxol carboplatin (every 3 weeks x 4) Keytruda  followed by Adriamycin  Cytoxan  Keytruda  (Dr. Selinda Vicenta Albino at [facility]): AC-K cycle 1 given on 08/01/2022 at [facility] (echo 04/25/2023: EF 55 to 60%, strain -18.5%)   08/04/2023 Cancer Staging   Staging form: Breast, AJCC 8th Edition - Clinical: Stage IIIC (cT4b, cN0, cM0, G3, ER-, PR-, HER2-) - Signed by Odean Potts, MD on 08/04/2023 Stage prefix: Initial diagnosis Histologic grading system: 3 grade system   08/04/2023 - 10/07/2023 Chemotherapy   Patient is on Treatment Plan : BREAST Pembrolizumab  (200) D1 + Carboplatin (5) D1 + Paclitaxel (80) D1,8,15 q21d X 4 cycles / Pembrolizumab  (200) D1 + AC D1 q21d x 4 cycles       CHIEF COMPLIANT: Follow-up on Xeloda   HISTORY OF PRESENT ILLNESS: Erika Lopez is 58 year old with  above-mentioned history of breast cancer is currently on adjuvant treatment with Xeloda .  She is tolerating it fairly well.  Her major complaints of darkened skin coloration especially on her feet.  She has had some itchiness on the palms.  Her major complaint today is skin changes around her right breast throughout the breast there are several small scaling whitish lesions that are itching profoundly.  She is applying moisturizers without any significant improvement.  From there is an oral standpoint she does not have any excessive diarrhea.  Infectious constipation and would welcome diarrhea.    ALLERGIES:  is allergic to wound dressing adhesive.  MEDICATIONS:  Current Outpatient Medications  Medication Sig Dispense Refill   silver  sulfADIAZINE  (SILVADENE ) 1 % cream Apply 1 Application topically daily. 50 g 0   capecitabine  (XELODA ) 500 MG tablet Take 3 tablets (1,500 mg total) by mouth 2 (two) times daily after a meal. 14 days on and 7 days off 84 tablet 7   diphenhydrAMINE (BENADRYL) 50 MG capsule Take 50 mg by mouth every 6 (six) hours as needed for allergies or itching.     gabapentin  (NEURONTIN ) 100 MG capsule Take 1 capsule (100 mg total) by mouth 3 (three) times daily. 90 capsule 0   meloxicam  (MOBIC ) 7.5 MG tablet Take 1 tablet (7.5 mg total) by mouth daily. 14 tablet 0   metoprolol  tartrate (LOPRESSOR ) 25 MG tablet Take 1 tablet (25 mg total) by mouth 2 (two) times daily. 60 tablet 3   oxyCODONE  (ROXICODONE ) 5 MG immediate release tablet Take 1 tablet (5 mg total) by mouth every 6 (six) hours as needed for severe pain (pain  score 7-10). 20 tablet 0   No current facility-administered medications for this visit.    PHYSICAL EXAMINATION: ECOG PERFORMANCE STATUS: 1 - Symptomatic but completely ambulatory  Vitals:   04/17/24 0919  BP: 118/72  Pulse: 100  Resp: 20  Temp: 98.1 F (36.7 C)  SpO2: 100%   Filed Weights   04/17/24 0919  Weight: 184 lb 9.6 oz (83.7 kg)       LABORATORY DATA:  I have reviewed the data as listed    Latest Ref Rng & Units 04/17/2024    9:06 AM 02/22/2024    9:54 AM 02/01/2024    2:15 PM  CMP  Glucose 70 - 99 mg/dL 893  91  98   BUN 6 - 20 mg/dL 10  12  12    Creatinine 0.44 - 1.00 mg/dL 9.32  9.30  9.35   Sodium 135 - 145 mmol/L 141  142  141   Potassium 3.5 - 5.1 mmol/L 4.0  4.1  3.8   Chloride 98 - 111 mmol/L 106  105  106   CO2 22 - 32 mmol/L 29  32  30   Calcium 8.9 - 10.3 mg/dL 9.3  9.3  9.3   Total Protein 6.5 - 8.1 g/dL 7.1  7.3  7.0   Total Bilirubin 0.0 - 1.2 mg/dL 0.7  0.5  0.3   Alkaline Phos 38 - 126 U/L 100  105  108   AST 15 - 41 U/L 14  16  14    ALT 0 - 44 U/L 14  14  11      Lab Results  Component Value Date   WBC 4.8 04/17/2024   HGB 12.9 04/17/2024   HCT 36.0 04/17/2024   MCV 87.2 04/17/2024   PLT 206 04/17/2024   NEUTROABS 2.9 04/17/2024       ASSESSMENT & PLAN:  Malignant neoplasm of lower-outer quadrant of right breast of female, estrogen receptor negative (HCC) 02/22/2023: [facility]: Palpable mass, mammogram detected right breast mass 5 cm from the nipple 1.6 cm, separate oval circumscribed hypoechoic mass 7 mm (?  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%, skin satellite nodule biopsy positive   Treatment plan: Neoadjuvant chemotherapy with Taxol carbo Keytruda  every 3 weeks x 4 (completed at [facility]), received first cycle of Adriamycin  Cytoxan  Keytruda  on 08/01/2022: Plan to complete keynote 522 regimen with cycle 2 Adriamycin  Cytoxan  Keytruda  to be given 08/23/2023, followed by Keytruda  maintenance (patient had serious adverse effects and discontinued Keytruda ) 11/07/2023: Right lumpectomy: Grade 3 IDC 1.8 cm, margins negative, negative for lymphovascular or perineural invasion, 0/3 lymph nodes  negative Adjuvant radiation therapy started 12/29/2023 -------------------------------------------------------------------------------------------------------------------- Chemotherapy-induced anemia Tachycardia: Prescribed metoprolol  25 mg p.o. twice daily Financial distress   Current Treatment: Capecitabine  Maintenance started 01/12/2024 (1500 mg p.o. twice daily 2 weeks on 1 week off) Capecitabine  Toxicities:  Darkened skin discoloration on her feet Itching of the hands  Breast eczema type lesions: I instructed her to apply cortisone cream and also send a prescription for Silvadene .   Return to clinic in 2 months with labs and follow-up ------------------------------------- Assessment and Plan Assessment & Plan       Orders Placed This Encounter  Procedures   CBC with Differential (Cancer Center Only)    Standing Status:   Future    Expiration Date:  04/17/2025   CMP (Cancer Center only)    Standing Status:   Future    Expiration Date:   04/17/2025   The patient has a good understanding of the overall plan. she agrees with it. she will call with any problems that may develop before the next visit here. Total time spent: 30 mins including face to face time and time spent for planning, charting and co-ordination of care   Erika MARLA Chad, MD 04/17/24

## 2024-04-17 NOTE — Progress Notes (Signed)
 Specialty Pharmacy Ongoing Clinical Assessment Note  Erika Lopez is a 58 y.o. female who is being followed by the specialty pharmacy service for RxSp Oncology   Patient's specialty medication(s) reviewed today: Capecitabine  (XELODA )   Missed doses in the last 4 weeks: 0   Patient/Caregiver did not have any additional questions or concerns.   Therapeutic benefit summary: Patient is achieving benefit   Adverse events/side effects summary: No adverse events/side effects   Patient's therapy is appropriate to: Continue    Goals Addressed             This Visit's Progress    Maintain optimal adherence to therapy   On track    Patient is on track. Patient will maintain adherence         Follow up: 3 months  [facility] Specialty Pharmacist

## 2024-04-18 DIAGNOSIS — Z741 Need for assistance with personal care: Secondary | ICD-10-CM | POA: Diagnosis not present

## 2024-04-19 DIAGNOSIS — Z741 Need for assistance with personal care: Secondary | ICD-10-CM | POA: Diagnosis not present

## 2024-04-21 DIAGNOSIS — Z741 Need for assistance with personal care: Secondary | ICD-10-CM | POA: Diagnosis not present

## 2024-04-22 ENCOUNTER — Other Ambulatory Visit (HOSPITAL_COMMUNITY): Payer: Self-pay

## 2024-04-22 ENCOUNTER — Other Ambulatory Visit: Payer: Self-pay

## 2024-04-22 DIAGNOSIS — Z741 Need for assistance with personal care: Secondary | ICD-10-CM | POA: Diagnosis not present

## 2024-04-23 DIAGNOSIS — Z741 Need for assistance with personal care: Secondary | ICD-10-CM | POA: Diagnosis not present

## 2024-05-01 ENCOUNTER — Other Ambulatory Visit: Payer: Self-pay | Admitting: Licensed Clinical Social Worker

## 2024-05-01 NOTE — Patient Outreach (Signed)
 Complex Care Management   Visit Note  05/01/2024  Name:  Erika Lopez MRN: [id number] DOB: 04-30-1966  Situation: Referral received for Complex Care Management related to Mental/Behavioral Health diagnosis MDD I obtained verbal consent from Patient.  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 tx    Assessment: Patient Reported Symptoms:  Cognitive Cognitive Status: Struggling with memory recall      Neurological Neurological Review of Symptoms: Headaches    HEENT HEENT Symptoms Reported: No symptoms reported HEENT Management Strategies: Coping strategies    Cardiovascular Cardiovascular Symptoms Reported: Chest pain or discomfort Cardiovascular Management Strategies: Medication therapy, Coping strategies Cardiovascular Self-Management Outcome: 4 (good)  Respiratory Respiratory Symptoms Reported: No symptoms reported    Endocrine Endocrine Symptoms Reported: Not assessed    Gastrointestinal Gastrointestinal Symptoms Reported: Not assessed      Genitourinary Genitourinary Symptoms Reported: Not assessed    Integumentary Integumentary Symptoms Reported: Not assessed    Musculoskeletal Musculoskelatal Symptoms Reviewed: Unsteady gait, Limited mobility Musculoskeletal Management Strategies: Coping strategies, Medication therapy      Psychosocial Psychosocial Symptoms Reported: Depression - if selected complete PHQ 2-9 Additional Psychological Details: Pt reports she tries to stay in the moment and manages her mental health symptoms. Anxiety and depression related to cance treatments. Denied participating in ongoing treatments at this time. Behavioral Management Strategies: Coping  strategies        05/01/2024    PHQ2-9 Depression Screening   Little interest or pleasure in doing things    Feeling down, depressed, or hopeless    PHQ-2 - Total Score    Trouble falling or staying asleep, or sleeping too much    Feeling tired or having little energy    Poor appetite or overeating     Feeling bad about yourself - or that you are a failure or have let yourself or your family down    Trouble concentrating on things, such as reading the newspaper or watching television    Moving or speaking so slowly that other people could have noticed.  Or the opposite - being so fidgety or restless that you have been moving around a lot more than usual    Thoughts that you would be better off dead, or hurting yourself in some way    PHQ2-9 Total Score    If you checked off any problems, how difficult have these problems made it for you to do your work, take care of things at home, or get along with other people    Depression Interventions/Treatment      There were no vitals filed for this visit.  Medications Reviewed Today   Medications were not reviewed in this encounter     Recommendation:   Continue Current Plan of Care  Follow Up Plan:   Patient has met all care management goals. Care Management case will be closed. Patient has been provided contact information should new needs arise.   Alm Armor, LCSW [place]/[facility], [facility] Licensed Clinical Social Worker Care Coordinator [phone]

## 2024-05-01 NOTE — Patient Instructions (Signed)
 Visit Information  Erika Lopez was given information about Medicaid Managed Care team care coordination services as a part of their [facility] Community Plan Medicaid benefit.   If you would like to schedule transportation through your [facility], please call the following number at least 2 days in advance of your appointment: [phone]   Rides for urgent appointments can also be made after hours by calling Member Services.  Call the Behavioral Health Crisis Line at [phone], at any time, 24 hours a day, 7 days a week. If you are in danger or need immediate medical attention call 911.    Patient verbalizes understanding of instructions and care plan provided today and agrees to view in MyChart. Active MyChart status and patient understanding of how to access instructions and care plan via MyChart confirmed with patient.     No further follow up required: Please re-refer with any new needs assessed.  Alm Armor, LCSW [place]/[facility] Care [facility], Population Health Licensed Clinical Social Worker Care Coordinator [phone]   Following is a copy of your plan of care:  There are no care plans that you recently modified to display for this patient.

## 2024-05-06 ENCOUNTER — Other Ambulatory Visit (HOSPITAL_COMMUNITY): Payer: Self-pay

## 2024-05-08 ENCOUNTER — Other Ambulatory Visit: Payer: Self-pay

## 2024-05-08 NOTE — Progress Notes (Signed)
 Specialty Pharmacy Refill Coordination Note  Erika Lopez is a 58 y.o. female contacted today regarding refills of specialty medication(s) Capecitabine  (XELODA )   Patient requested Delivery   Delivery date: 05/17/24   Verified address: [address]  [place]   Medication will be filled on 05/16/24.

## 2024-05-13 ENCOUNTER — Telehealth: Payer: Self-pay

## 2024-05-13 NOTE — Patient Instructions (Signed)
 Lenise Dilworth - I am sorry I was unable to reach you today for our scheduled appointment. I work with [name], NP and am calling to support your healthcare needs. Please contact me at [phone] at your earliest convenience. I look forward to speaking with you soon.   Thank you,  Hendricks Her RN, BSN  [place] [facility] VBCI-Population Health RN Case Manager   Direct [phone]

## 2024-05-15 ENCOUNTER — Other Ambulatory Visit: Payer: Self-pay

## 2024-05-15 NOTE — Patient Instructions (Signed)
 Erika Lopez - I am sorry I was unable to reach you today for our scheduled appointment. I work with [name], NP and am calling to support your healthcare needs. I have rescheduled your appointment for Monday 05/20/2024 at 11:00 AM .Please contact me at [phone] with any questions I look forward to speaking with you soon.   Thank you,  Hendricks Her RN, BSN  [place] I VBCI-Population Health RN Case Manager   Direct [phone]

## 2024-05-15 NOTE — Patient Instructions (Signed)
 Erika Lopez - I am sorry I was unable to reach you today for our scheduled appointment. I work with [name], NP and am calling to support your healthcare needs. Please contact me at [phone] at your earliest convenience. I look forward to speaking with you soon.   Thank you,  Hendricks Her RN, BSN  [place] [facility] RN Case Manager   Direct [phone]

## 2024-05-16 ENCOUNTER — Other Ambulatory Visit: Payer: Self-pay

## 2024-05-20 ENCOUNTER — Other Ambulatory Visit: Payer: Self-pay

## 2024-05-20 NOTE — Patient Instructions (Signed)
 Erika Lopez - I am sorry I was unable to reach you today for our scheduled appointment. I have rescheduled you for Wednesday 05-22-2024 at 2:00 Please contact me at [phone] if you have any questions  I look forward to speaking with you soon.   Thank you,  Hendricks Her RN, BSN  [place] [facility] RN Case Manager   Direct [phone]

## 2024-05-22 ENCOUNTER — Other Ambulatory Visit: Payer: Self-pay

## 2024-05-22 NOTE — Patient Instructions (Signed)
 Visit Information  Ms. Erika Lopez was given information about Medicaid Managed Care team care coordination services as a part of their [facility] Community Plan Medicaid benefit.   If you would like to schedule transportation through your [facility], please call the following number at least 2 days in advance of your appointment: [phone]   Rides for urgent appointments can also be made after hours by calling Member Services.  Call the Behavioral Health Crisis Line at [phone], at any time, 24 hours a day, 7 days a week. If you are in danger or need immediate medical attention call 911.  Please see education materials related to managing cancer pain provided by MyChart link.  Care plan and visit instructions communicated with the patient verbally today. Patient agrees to receive a copy in MyChart. Active MyChart status and patient understanding of how to access instructions and care plan via MyChart confirmed with patient.     Telephone follow up appointment with Managed Medicaid care management team member scheduled for:06-18-2024 at 3:00 PM  Hendricks Her RN, BSN  [place] [facility]Population [facility] RN Case Manager   Direct [phone]   Following is a copy of your plan of care:  There are no care plans that you recently modified to display for this patient.

## 2024-05-25 DIAGNOSIS — R0602 Shortness of breath: Secondary | ICD-10-CM | POA: Diagnosis not present

## 2024-05-26 DIAGNOSIS — R0602 Shortness of breath: Secondary | ICD-10-CM | POA: Diagnosis not present

## 2024-05-27 ENCOUNTER — Encounter: Payer: Self-pay | Admitting: Radiology

## 2024-05-27 DIAGNOSIS — R0602 Shortness of breath: Secondary | ICD-10-CM | POA: Diagnosis not present

## 2024-05-28 DIAGNOSIS — R0602 Shortness of breath: Secondary | ICD-10-CM | POA: Diagnosis not present

## 2024-05-29 DIAGNOSIS — R0602 Shortness of breath: Secondary | ICD-10-CM | POA: Diagnosis not present

## 2024-05-30 ENCOUNTER — Other Ambulatory Visit (HOSPITAL_COMMUNITY): Payer: Self-pay

## 2024-05-30 DIAGNOSIS — R0602 Shortness of breath: Secondary | ICD-10-CM | POA: Diagnosis not present

## 2024-05-31 DIAGNOSIS — R0602 Shortness of breath: Secondary | ICD-10-CM | POA: Diagnosis not present

## 2024-06-01 DIAGNOSIS — R0602 Shortness of breath: Secondary | ICD-10-CM | POA: Diagnosis not present

## 2024-06-02 DIAGNOSIS — R0602 Shortness of breath: Secondary | ICD-10-CM | POA: Diagnosis not present

## 2024-06-03 ENCOUNTER — Other Ambulatory Visit: Payer: Self-pay

## 2024-06-03 DIAGNOSIS — R0602 Shortness of breath: Secondary | ICD-10-CM | POA: Diagnosis not present

## 2024-06-04 ENCOUNTER — Inpatient Hospital Stay: Admitting: Adult Health

## 2024-06-04 DIAGNOSIS — R0602 Shortness of breath: Secondary | ICD-10-CM | POA: Diagnosis not present

## 2024-06-05 ENCOUNTER — Other Ambulatory Visit: Payer: Self-pay | Admitting: Pharmacy Technician

## 2024-06-05 ENCOUNTER — Other Ambulatory Visit: Payer: Self-pay

## 2024-06-05 DIAGNOSIS — R0602 Shortness of breath: Secondary | ICD-10-CM | POA: Diagnosis not present

## 2024-06-05 NOTE — Progress Notes (Signed)
 Specialty Pharmacy Refill Coordination Note  Erika Lopez is a 58 y.o. female contacted today regarding refills of specialty medication(s) Capecitabine  (XELODA )   Patient requested Delivery   Delivery date: 06/06/24   Verified [place]: [address]  [place]   Medication will be filled on: 06/05/24

## 2024-06-06 DIAGNOSIS — R0602 Shortness of breath: Secondary | ICD-10-CM | POA: Diagnosis not present

## 2024-06-07 DIAGNOSIS — R0602 Shortness of breath: Secondary | ICD-10-CM | POA: Diagnosis not present

## 2024-06-08 DIAGNOSIS — R0602 Shortness of breath: Secondary | ICD-10-CM | POA: Diagnosis not present

## 2024-06-10 DIAGNOSIS — R0602 Shortness of breath: Secondary | ICD-10-CM | POA: Diagnosis not present

## 2024-06-11 ENCOUNTER — Ambulatory Visit: Admitting: Physician Assistant

## 2024-06-11 DIAGNOSIS — R0602 Shortness of breath: Secondary | ICD-10-CM | POA: Diagnosis not present

## 2024-06-12 DIAGNOSIS — R0602 Shortness of breath: Secondary | ICD-10-CM | POA: Diagnosis not present

## 2024-06-13 DIAGNOSIS — R0602 Shortness of breath: Secondary | ICD-10-CM | POA: Diagnosis not present

## 2024-06-14 DIAGNOSIS — R0602 Shortness of breath: Secondary | ICD-10-CM | POA: Diagnosis not present

## 2024-06-15 DIAGNOSIS — R0602 Shortness of breath: Secondary | ICD-10-CM | POA: Diagnosis not present

## 2024-06-16 DIAGNOSIS — R0602 Shortness of breath: Secondary | ICD-10-CM | POA: Diagnosis not present

## 2024-06-17 DIAGNOSIS — R0602 Shortness of breath: Secondary | ICD-10-CM | POA: Diagnosis not present

## 2024-06-18 ENCOUNTER — Other Ambulatory Visit: Payer: Self-pay

## 2024-06-18 ENCOUNTER — Inpatient Hospital Stay: Admitting: Adult Health

## 2024-06-18 DIAGNOSIS — R0602 Shortness of breath: Secondary | ICD-10-CM | POA: Diagnosis not present

## 2024-06-18 NOTE — Patient Instructions (Signed)
 Visit Information  Ms. Voigt was given information about Medicaid Managed Care team care coordination services as a part of their [place] [facility] Community Plan Medicaid benefit.   If you would like to schedule transportation through your [facility], please call the following number at least 2 days in advance of your appointment: [phone]   Rides for urgent appointments can also be made after hours by calling Member Services.  Call the Behavioral Health Crisis Line at [phone], at any time, 24 hours a day, 7 days a week. If you are in danger or need immediate medical attention call 911.  Please see education materials related to cancer nutrition  provided by MyChart link.  Care plan and visit instructions communicated with the patient verbally today. Patient agrees to receive a copy in MyChart. Active MyChart status and patient understanding of how to access instructions and care plan via MyChart confirmed with patient.     Telephone follow up appointment with Managed Medicaid care management team member scheduled for:07-21-2024 at 3:00 PM  Hendricks Her RN, BSN  [place] [facility]BC[facility]Population Health RN Case Manager   Direct [phone]   Following is a copy of your plan of care:  There are no care plans that you recently modified to display for this patient.

## 2024-06-19 ENCOUNTER — Other Ambulatory Visit: Payer: Self-pay

## 2024-06-19 DIAGNOSIS — R0602 Shortness of breath: Secondary | ICD-10-CM | POA: Diagnosis not present

## 2024-06-20 DIAGNOSIS — R0602 Shortness of breath: Secondary | ICD-10-CM | POA: Diagnosis not present

## 2024-06-21 ENCOUNTER — Other Ambulatory Visit: Payer: Self-pay

## 2024-06-21 DIAGNOSIS — R0602 Shortness of breath: Secondary | ICD-10-CM | POA: Diagnosis not present

## 2024-06-22 DIAGNOSIS — R0602 Shortness of breath: Secondary | ICD-10-CM | POA: Diagnosis not present

## 2024-06-23 DIAGNOSIS — R0602 Shortness of breath: Secondary | ICD-10-CM | POA: Diagnosis not present

## 2024-06-25 ENCOUNTER — Other Ambulatory Visit: Payer: Self-pay

## 2024-06-25 NOTE — Progress Notes (Signed)
 Specialty Pharmacy Refill Coordination Note  Erika Lopez is a 58 y.o. female contacted today regarding refills of specialty medication(s) Capecitabine  (XELODA )   Patient requested Delivery   Delivery date: 06/27/24   Verified address: [address]  [place]   Medication will be filled on: 06/26/24

## 2024-07-02 ENCOUNTER — Other Ambulatory Visit: Payer: Self-pay

## 2024-07-02 NOTE — Progress Notes (Signed)
 Specialty Pharmacy Ongoing Clinical Assessment Note  Erika Lopez is a 58 y.o. female who is being followed by the specialty pharmacy service for RxSp Oncology   Patient's specialty medication(s) reviewed today: Capecitabine  (XELODA )   Missed doses in the last 4 weeks: 0   Patient/Caregiver did not have any additional questions or concerns.   Therapeutic benefit summary: Patient is achieving benefit   Adverse events/side effects summary: No adverse events/side effects   Patient's therapy is appropriate to: Continue    Goals Addressed             This Visit's Progress    Maintain optimal adherence to therapy   On track    Patient is on track. Patient will maintain adherence         Follow up: 3 months  [facility] Specialty Pharmacist

## 2024-07-09 ENCOUNTER — Other Ambulatory Visit: Payer: Self-pay | Admitting: Hematology and Oncology

## 2024-07-09 ENCOUNTER — Other Ambulatory Visit: Payer: Self-pay

## 2024-07-09 MED ORDER — CAPECITABINE 500 MG PO TABS
1500.0000 mg | ORAL_TABLET | Freq: Two times a day (BID) | ORAL | 7 refills | Status: DC
  Filled 2024-07-09: qty 84, 21d supply, fill #0

## 2024-07-11 ENCOUNTER — Other Ambulatory Visit: Payer: Self-pay | Admitting: Pharmacy Technician

## 2024-07-11 ENCOUNTER — Other Ambulatory Visit: Payer: Self-pay

## 2024-07-11 NOTE — Progress Notes (Signed)
 Received call from pt stating she's noticed thinning hair in a couple of spots on her scalp and dryness. Pt denies any signs of infection at this time. RN consulted pharmacist Norleen Parkinson regarding if this is an expected side effect of oral chemo capecitabine , he denies that it is. Pt agreeable to wait till appointment next week with Dr. Odean to address hair loss concern as well as treatment schedule with Capcitabine.

## 2024-07-11 NOTE — Progress Notes (Signed)
 Disenrolled; Spoke with patient & states the cycle she is on is her last & completed. Rph Beth check chart to confirm disenrolled

## 2024-07-16 LAB — COLOGUARD: COLOGUARD: NEGATIVE

## 2024-07-17 ENCOUNTER — Inpatient Hospital Stay: Attending: Hematology and Oncology

## 2024-07-17 ENCOUNTER — Inpatient Hospital Stay: Admitting: Hematology and Oncology

## 2024-07-17 ENCOUNTER — Telehealth: Payer: Self-pay

## 2024-07-17 VITALS — BP 118/65 | HR 93 | Temp 98.0°F | Resp 18 | Ht 70.0 in | Wt 188.4 lb

## 2024-07-17 DIAGNOSIS — Z171 Estrogen receptor negative status [ER-]: Secondary | ICD-10-CM

## 2024-07-17 DIAGNOSIS — Z79899 Other long term (current) drug therapy: Secondary | ICD-10-CM | POA: Diagnosis not present

## 2024-07-17 DIAGNOSIS — C50511 Malignant neoplasm of lower-outer quadrant of right female breast: Secondary | ICD-10-CM

## 2024-07-17 DIAGNOSIS — D6481 Anemia due to antineoplastic chemotherapy: Secondary | ICD-10-CM | POA: Insufficient documentation

## 2024-07-17 DIAGNOSIS — Z599 Problem related to housing and economic circumstances, unspecified: Secondary | ICD-10-CM | POA: Diagnosis not present

## 2024-07-17 DIAGNOSIS — Z9221 Personal history of antineoplastic chemotherapy: Secondary | ICD-10-CM | POA: Insufficient documentation

## 2024-07-17 DIAGNOSIS — R000 Tachycardia, unspecified: Secondary | ICD-10-CM | POA: Insufficient documentation

## 2024-07-17 LAB — CMP (CANCER CENTER ONLY)
ALT: 9 U/L (ref 0–44)
AST: 17 U/L (ref 15–41)
Albumin: 4.3 g/dL (ref 3.5–5.0)
Alkaline Phosphatase: 127 U/L — ABNORMAL HIGH (ref 38–126)
Anion gap: 10 (ref 5–15)
BUN: 8 mg/dL (ref 6–20)
CO2: 26 mmol/L (ref 22–32)
Calcium: 9.3 mg/dL (ref 8.9–10.3)
Chloride: 106 mmol/L (ref 98–111)
Creatinine: 0.71 mg/dL (ref 0.44–1.00)
GFR, Estimated: >60 mL/min
Glucose, Bld: 100 mg/dL — ABNORMAL HIGH (ref 70–99)
Potassium: 4.3 mmol/L (ref 3.5–5.1)
Sodium: 141 mmol/L (ref 135–145)
Total Bilirubin: 0.4 mg/dL (ref 0.0–1.2)
Total Protein: 7.1 g/dL (ref 6.5–8.1)

## 2024-07-17 LAB — CBC WITH DIFFERENTIAL (CANCER CENTER ONLY)
Abs Immature Granulocytes: 0.03 K/uL (ref 0.00–0.07)
Basophils Absolute: 0 K/uL (ref 0.0–0.1)
Basophils Relative: 1 %
Eosinophils Absolute: 0.1 K/uL (ref 0.0–0.5)
Eosinophils Relative: 2 %
HCT: 36.5 % (ref 36.0–46.0)
Hemoglobin: 13.3 g/dL (ref 12.0–15.0)
Immature Granulocytes: 1 %
Lymphocytes Relative: 33 %
Lymphs Abs: 1.6 K/uL (ref 0.7–4.0)
MCH: 32 pg (ref 26.0–34.0)
MCHC: 36.4 g/dL — ABNORMAL HIGH (ref 30.0–36.0)
MCV: 88 fL (ref 80.0–100.0)
Monocytes Absolute: 0.6 K/uL (ref 0.1–1.0)
Monocytes Relative: 12 %
Neutro Abs: 2.6 K/uL (ref 1.7–7.7)
Neutrophils Relative %: 51 %
Platelet Count: 216 K/uL (ref 150–400)
RBC: 4.15 MIL/uL (ref 3.87–5.11)
RDW: 14.6 % (ref 11.5–15.5)
WBC Count: 4.9 K/uL (ref 4.0–10.5)
nRBC: 0 % (ref 0.0–0.2)

## 2024-07-17 NOTE — Assessment & Plan Note (Signed)
 02/22/2023: [facility]: Palpable mass, mammogram detected right breast mass 5 cm from the nipple 1.6 cm, separate oval circumscribed hypoechoic mass 7 mm (?  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%, skin satellite nodule biopsy positive   Treatment plan: Neoadjuvant chemotherapy with Taxol carbo Keytruda  every 3 weeks x 4 (completed at [facility]), received first cycle of Adriamycin  Cytoxan  Keytruda  on 08/01/2022: Plan to complete keynote 522 regimen with cycle 2 Adriamycin  Cytoxan  Keytruda  to be given 08/23/2023, followed by Keytruda  maintenance (patient had serious adverse effects and discontinued Keytruda ) 11/07/2023: Right lumpectomy: Grade 3 IDC 1.8 cm, margins negative, negative for lymphovascular or perineural invasion, 0/3 lymph nodes negative Adjuvant radiation therapy started 12/29/2023 -------------------------------------------------------------------------------------------------------------------- Chemotherapy-induced anemia Tachycardia: Prescribed metoprolol  25 mg p.o. twice daily Financial distress   Current Treatment: Capecitabine  Maintenance started 01/12/2024 (1500 mg p.o. twice daily 2 weeks on 1 week off) Capecitabine  Toxicities:  Darkened skin discoloration on her feet Itching of the hands   Breast eczema type lesions: I instructed her to apply cortisone cream and also send a prescription for Silvadene .   This concludes capecitabine  adjuvant therapy. Return to clinic in 6 months for follow-up

## 2024-07-17 NOTE — Progress Notes (Signed)
 "  Patient Care Team: [name], NP as PCP - General (Nurse Practitioner) Odean Potts, MD as Consulting Physician (Hematology and Oncology) Tyree Nanetta SAILOR, RN as Oncology Nurse Navigator Curvin Deward MOULD, MD as Consulting Physician (General Surgery) Kay Hendricks MATSU, RN as [facility] Care Management  DIAGNOSIS:  Encounter Diagnosis  Name Primary?   Malignant neoplasm of lower-outer quadrant of right breast of female, estrogen receptor negative (HCC) Yes    SUMMARY OF ONCOLOGIC HISTORY: Oncology History  Malignant neoplasm of lower-outer quadrant of right breast of female, estrogen receptor negative (HCC)  02/22/2023 Initial Diagnosis   [facility]: Palpable mass, mammogram detected right breast mass 5 cm from the nipple 1.6 cm, separate oval circumscribed hypoechoic mass 7 mm (?  Intramammary lymph node: Benign) grouped amorphous calcifications UOQ left breast 7 mm (benign), right axillary lymph node biopsy negative Biopsy: Poorly differentiated IDC ER less than 1%, PR less than 1%, HER2 0, Ki-67 90%, skin satellite nodule biopsy positive   04/28/2023 -  Neo-Adjuvant Chemotherapy   Neoadjuvant chemotherapy with Taxol carboplatin (every 3 weeks x 4) Keytruda  followed by Adriamycin  Cytoxan  Keytruda  (Dr. Selinda Vicenta Albino at [facility]): AC-K cycle 1 given on 08/01/2022 at [facility] (echo 04/25/2023: EF 55 to 60%, strain -18.5%)   08/04/2023 Cancer Staging   Staging form: Breast, AJCC 8th Edition - Clinical: Stage IIIC (cT4b, cN0, cM0, G3, ER-, PR-, HER2-) - Signed by Odean Potts, MD on 08/04/2023 Stage prefix: Initial diagnosis Histologic grading system: 3 grade system   08/04/2023 - 10/07/2023 Chemotherapy   Patient is on Treatment Plan : BREAST Pembrolizumab  (200) D1 + Carboplatin (5) D1 + Paclitaxel (80) D1,8,15 q21d X 4 cycles / Pembrolizumab  (200) D1 + AC D1 q21d x 4 cycles       CHIEF COMPLIANT: Follow-up at the end of Xeloda   HISTORY OF PRESENT ILLNESS:   History of Present  Illness Erika Lopez is a 58 year old woman with 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  She has completed all planned therapy for high-risk triple-negative breast cancer, including neoadjuvant Taxol, Carboplatin, Keytruda , Adriamycin , and Cyclophosphamide , right lumpectomy, adjuvant radiation, and six months of adjuvant capecitabine , which ended this month. She received an additional shipment of capecitabine  but has not taken more doses.  She has persistent severe right hip pain, described as feeling like somebody stepping in my hip, with difficulty standing. The pain began early in her immunotherapy course and prompted a hospital visit where arthritis was suggested. She declined rheumatology referral and believes the pain is from prior pembrolizumab .  She has ongoing cutaneous hyperpigmentation of the hands and patchy alopecia, which she associates with prior chemotherapy. She is considering shaving her head and asks about the expected course of these treatment-related changes.  She has had recent weight gain that she attributes to higher caloric intake and alcohol use at social events. She reports good mood and is planning further family celebrations.       ALLERGIES:  is allergic to wound dressing adhesive.  MEDICATIONS:  Current Outpatient Medications  Medication Sig Dispense Refill   BLACK CURRANT SEED OIL PO Take by mouth.     capecitabine  (XELODA ) 500 MG tablet Take 3 tablets (1,500 mg total) by mouth 2 (two) times daily after a meal. 14 days on and 7 days off 84 tablet 7   diphenhydrAMINE (BENADRYL) 50 MG capsule Take 50 mg by mouth every 6 (six) hours as needed for  allergies or itching.     gabapentin  (NEURONTIN ) 100 MG capsule Take 1 capsule (100 mg total) by mouth 3 (three) times daily. 90 capsule 0   meloxicam  (MOBIC ) 7.5 MG tablet Take 1 tablet (7.5 mg total) by mouth daily.  14 tablet 0   metoprolol  tartrate (LOPRESSOR ) 25 MG tablet Take 1 tablet (25 mg total) by mouth 2 (two) times daily. 60 tablet 3   oxyCODONE  (ROXICODONE ) 5 MG immediate release tablet Take 1 tablet (5 mg total) by mouth every 6 (six) hours as needed for severe pain (pain score 7-10). 20 tablet 0   silver  sulfADIAZINE  (SILVADENE ) 1 % cream Apply 1 Application topically daily. 50 g 0   No current facility-administered medications for this visit.    PHYSICAL EXAMINATION: ECOG PERFORMANCE STATUS: 1 - Symptomatic but completely ambulatory  Vitals:   07/17/24 0857  BP: 118/65  Pulse: 93  Resp: 18  Temp: 98 F (36.7 C)  SpO2: 100%   Filed Weights   07/17/24 0857  Weight: 188 lb 6.4 oz (85.5 kg)      LABORATORY DATA:  I have reviewed the data as listed    Latest Ref Rng & Units 07/17/2024    8:41 AM 04/17/2024    9:06 AM 02/22/2024    9:54 AM  CMP  Glucose 70 - 99 mg/dL 899  893  91   BUN 6 - 20 mg/dL 8  10  12    Creatinine 0.44 - 1.00 mg/dL 9.28  9.32  9.30   Sodium 135 - 145 mmol/L 141  141  142   Potassium 3.5 - 5.1 mmol/L 4.3  4.0  4.1   Chloride 98 - 111 mmol/L 106  106  105   CO2 22 - 32 mmol/L 26  29  32   Calcium 8.9 - 10.3 mg/dL 9.3  9.3  9.3   Total Protein 6.5 - 8.1 g/dL 7.1  7.1  7.3   Total Bilirubin 0.0 - 1.2 mg/dL 0.4  0.7  0.5   Alkaline Phos 38 - 126 U/L 127  100  105   AST 15 - 41 U/L 17  14  16    ALT 0 - 44 U/L 9  14  14      Lab Results  Component Value Date   WBC 4.9 07/17/2024   HGB 13.3 07/17/2024   HCT 36.5 07/17/2024   MCV 88.0 07/17/2024   PLT 216 07/17/2024   NEUTROABS 2.6 07/17/2024    ASSESSMENT & PLAN:  Malignant neoplasm of lower-outer quadrant of right breast of female, estrogen receptor negative (HCC) 02/22/2023: [facility]: Palpable mass, mammogram detected right breast mass 5 cm from the nipple 1.6 cm, separate oval circumscribed hypoechoic mass 7 mm (?  Intramammary lymph node: Benign) grouped amorphous calcifications UOQ left  breast 7 mm (benign), right axillary lymph node biopsy positive Biopsy: Poorly differentiated IDC ER less than 1%, PR less than 1%, HER2 0, Ki-67 90%, skin satellite nodule biopsy positive   Treatment plan: Neoadjuvant chemotherapy with Taxol carbo Keytruda  every 3 weeks x 4 (completed at [facility]), received first cycle of Adriamycin  Cytoxan  Keytruda  on 08/01/2022: Plan to complete keynote 522 regimen with cycle 2 Adriamycin  Cytoxan  Keytruda  to be given 08/23/2023, followed by Keytruda  maintenance (patient had serious adverse effects and discontinued Keytruda ) 11/07/2023: Right lumpectomy: Grade 3 IDC 1.8 cm, margins negative, negative for lymphovascular or perineural invasion, 0/3 lymph nodes negative Adjuvant radiation therapy started 12/29/2023 -------------------------------------------------------------------------------------------------------------------- Chemotherapy-induced anemia Tachycardia: Prescribed metoprolol  25 mg p.o.  twice daily Financial distress   Current Treatment: Capecitabine  Maintenance started 01/12/2024 (1500 mg p.o. twice daily 2 weeks on 1 week off) Capecitabine  Toxicities:  Darkened skin discoloration on her feet Itching of the hands   Breast eczema type lesions: I instructed her to apply cortisone cream and also send a prescription for Silvadene .   This concludes capecitabine  adjuvant therapy. Left hip pain: Will obtain CT chest abdomen pelvis for staging purposes in January.  Telephone visit after that to discuss results. 8-month survivorship care plan visit. Breast cancer surveillance: Patient will need mammograms as well as the guardant reveal.    No orders of the defined types were placed in this encounter.  The patient has a good understanding of the overall plan. she agrees with it. she will call with any problems that may develop before the next visit here.  I personally spent a total of 30 minutes in the care of the patient today including preparing to  see the patient, getting/reviewing separately obtained history, performing a medically appropriate exam/evaluation, counseling and educating, placing orders, referring and communicating with other health care professionals, documenting clinical information in the EHR, independently interpreting results, communicating results, and coordinating care.   Viinay K Hollye Pritt, MD 07/17/2024    "

## 2024-07-17 NOTE — Telephone Encounter (Signed)
 Per md orders entered for Guardant Reveal and all supported documents were uploaded through the portal.

## 2024-07-18 ENCOUNTER — Ambulatory Visit: Payer: Self-pay | Admitting: Student

## 2024-07-22 ENCOUNTER — Telehealth: Payer: Self-pay

## 2024-07-24 ENCOUNTER — Telehealth: Payer: Self-pay

## 2024-07-24 NOTE — Patient Instructions (Signed)
 Randalyn Wimberley - I am sorry I was unable to reach you today for our scheduled appointment. I work with [name], NP and am calling to support your healthcare needs. Please contact me at [phone] at your earliest convenience. I look forward to speaking with you soon.   Thank you,  Hendricks Her RN, BSN  [place] [facility]Population [facility] RN Case Manager   Direct [phone]

## 2024-07-26 ENCOUNTER — Inpatient Hospital Stay: Admitting: Adult Health

## 2024-07-30 ENCOUNTER — Telehealth: Payer: Self-pay

## 2024-07-30 ENCOUNTER — Other Ambulatory Visit: Payer: Self-pay

## 2024-07-31 ENCOUNTER — Telehealth: Payer: Self-pay

## 2024-07-31 ENCOUNTER — Other Ambulatory Visit: Payer: Self-pay | Admitting: *Deleted

## 2024-07-31 DIAGNOSIS — C50511 Malignant neoplasm of lower-outer quadrant of right female breast: Secondary | ICD-10-CM

## 2024-07-31 NOTE — Telephone Encounter (Signed)
 Copied from CRM [id number]. Topic: Clinical - Lab/Test Results >> Jul 31, 2024  1:30 PM Suzen RAMAN wrote: Reason for CRM: Patient made aware of Cologuard Test per provider chart notations.

## 2024-07-31 NOTE — Patient Outreach (Signed)
 Complex Care Management   Visit Note  07/31/2024  Name:  Erika Lopez MRN: [id number] DOB: May 06, 1966  Situation: Referral received for Complex Care Management related to breast cancer  I obtained verbal consent from Patient.  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 tx    Assessment: Patient Reported Symptoms:  Cognitive Cognitive Status: Struggling with memory recall Cognitive/Intellectual Conditions Management [RPT]: None reported or documented in medical history or problem list   Health Maintenance Behaviors: Healthy diet Healing Pattern: Slow Health Facilitated by: Pain control, Prayer/meditation  Neurological Neurological Review of Symptoms: Dizziness, Other: [place] Neurological Symptoms/Conditions [RPT]: tingling in hands and feet Neurological Management Strategies: Adequate rest, Coping strategies, Routine screening, Medication therapy Neurological Self-Management Outcome: 4 (good)  HEENT HEENT Symptoms Reported: No symptoms reported HEENT Management Strategies: Coping strategies, Routine screening HEENT Self-Management Outcome: 4 (good)    Cardiovascular Does patient have uncontrolled Hypertension?: Yes Is patient checking Blood Pressure at home?: Yes Cardiovascular Management Strategies: Medication therapy Weight: 180 lb (81.6 kg) (patient reports) Cardiovascular Self-Management Outcome: 4 (good)  Respiratory Respiratory Symptoms Reported: No symptoms reported Respiratory Management Strategies: Coping strategies, Routine screening Respiratory Self-Management Outcome: 4 (good)  Endocrine Endocrine Symptoms Reported: No symptoms reported Is patient diabetic?: No Endocrine  Self-Management Outcome: 4 (good)  Gastrointestinal Gastrointestinal Symptoms Reported: Diarrhea Additional Gastrointestinal Details: lately Gastrointestinal Management Strategies: Coping strategies Gastrointestinal Self-Management Outcome: 4 (good)    Genitourinary Genitourinary Symptoms Reported: No symptoms reported Genitourinary Management Strategies: Coping strategies Genitourinary Self-Management Outcome: 4 (good)  Integumentary Integumentary Symptoms Reported: No symptoms reported Other Integumentary Symptoms: dry skin Skin Management Strategies: Coping strategies Skin Self-Management Outcome: 4 (good)  Musculoskeletal Musculoskelatal Symptoms Reviewed: Unsteady gait, Limited mobility Musculoskeletal Management Strategies: Coping strategies, Medication therapy Musculoskeletal Self-Management Outcome: 4 (good) Falls in the past year?: Yes Number of falls in past year: 1 or less Was there an injury with Fall?: Yes Fall Risk Category Calculator: 2 Patient Fall Risk Level: Moderate Fall Risk Patient at Risk for Falls Due to: History of fall(s) Fall risk Follow up: Falls evaluation completed, Education provided, Falls prevention discussed  Psychosocial Psychosocial Symptoms Reported: Anxiety - if selected complete GAD Behavioral Management Strategies: Coping strategies Behavioral Health Self-Management Outcome: 3 (uncertain) Major Change/Loss/Stressor/Fears (CP): Medical condition, self Techniques to Cope with Loss/Stress/Change: Spiritual practice(s), Meditation, Withdraw Quality of Family Relationships: helpful, involved, supportive Do you feel physically threatened by others?: No    07/31/2024    PHQ2-9 Depression Screening   Little interest or pleasure in doing things Nearly every day  Feeling down, depressed, or hopeless More than half the days  PHQ-2 - Total Score 5  Trouble falling or staying asleep, or sleeping too much More than half the days  Feeling tired or having  little energy Nearly every day  Poor appetite or overeating  More than half the days  Feeling bad about yourself - or that you are a failure or have let yourself or your family down Nearly every day  Trouble concentrating on things, such as reading the newspaper or watching television Nearly every day  Moving or speaking so slowly that other people could have noticed.  Or  the opposite - being so fidgety or restless that you have been moving around a lot more than usual Not at all  Thoughts that you would be better off dead, or hurting yourself in some way Not at all  PHQ2-9 Total Score 18  If you checked off any problems, how difficult have these problems made it for you to do your work, take care of things at home, or get along with other people Somewhat difficult  Depression Interventions/Treatment Medication    Today's Vitals   07/30/24 1534  BP: 126/86  Pulse: (!) 118  Weight: 180 lb (81.6 kg)   Pain Scale: 0-10 Pain Score: 8  Pain Type: Chronic pain Pain Location: Hip Pain Orientation: Left Pain Descriptors / Indicators: Aching, Constant Pain Onset: On-going Patients Stated Pain Goal: 0 Multiple Pain Sites: No  Medications Reviewed Today   Medications were not reviewed in this encounter     Recommendation:   Continue Current Plan of Care  Follow Up Plan:   Telephone follow-up in 1 month  Hendricks Her RN, BSN  [place] [facility]Population [facility] RN Case Information Systems Manager [phone]

## 2024-07-31 NOTE — Patient Instructions (Signed)
 "   Visit Information  Erika Lopez was given information about Medicaid Managed Care team care coordination services as a part of their [facility] Medicaid benefit.   If you would like to schedule transportation through your [facility], please call the following number at least 2 days in advance of your appointment: [phone]   Rides for urgent appointments can also be made after hours by calling Member Services.  Call the Behavioral Health Crisis Line at [phone], at any time, 24 hours a day, 7 days a week. If you are in danger or need immediate medical attention call 911.  Please see education materials related to breast cancer provided by MyChart link.  Care plan and visit instructions communicated with the patient verbally today. Patient agrees to receive a copy in MyChart. Active MyChart status and patient understanding of how to access instructions and care plan via MyChart confirmed with patient.     Telephone follow up appointment with Managed Medicaid care management team member scheduled for:09/02/2024 at 10:30 AM   Hendricks Her RN, BSN  [place] [facility] RN Case Manager   Direct [phone]   Following is a copy of your plan of care:   Goals Addressed             This Visit's Progress    VBCI RN Care Plan   On track    Problems:  Chronic Disease Management support and education needs related to breast cancer   Goal: Over the next 90 days the Patient will attend all scheduled medical appointments: with providers as evidenced by provider encounter in [facility] for each scheduled appointment         continue to work with RN Care Manager and/or Social Worker to address care management and care coordination needs related to breast cancer as evidenced by adherence to care management team scheduled appointments     demonstrate Ongoing health management independence as evidenced by following providers care plan          demonstrate understanding of rationale for each prescribed medication as evidenced by verbalizing use of medications during review    take all medications exactly as prescribed and will call provider for medication related questions as evidenced by communication with Provider regarding medication questions or concern     work with child psychotherapist to address Financial constraints related to breast cancer and Limited access to food related to the management of breast cancer as evidenced by review of electronic medical record and patient or social worker report     UPDATE 07/30/2024 Patient will re-establish access to My Chart as evidenced by recent access to viewing provider notes and last user login date   Interventions:   Oncology: Assessment of understanding of oncology diagnosis:  Assessed patient understanding of cancer diagnosis and recommended treatment plan Reviewed upcoming provider appointments and treatment appointments Assessed available transportation to appointments and treatments. Has consistent/reliable transportation: Yes Assessed support system. Has consistent/reliable family or other support: Yes PHQ2/PHQ9 performed Nutrition assessment performed My Chart help desk contact information given to patient [phone]  RNCM sent message to Survivorship NP to request patient get an  earlier appointment than March 2026. Patient has finished treatment and is anxious for that additional support as soon as possible   Patient Self-Care Activities:  Attend all scheduled provider appointments Call pharmacy for medication refills 3-7 days in advance of running out of medications Call provider office for new concerns or questions  Notify RN  Care Manager of TOC call rescheduling needs Take medications as prescribed   Work with the social worker to address care coordination needs and will continue to work with the clinical team to address health care and disease management related  needs Patient will ask Mother for assistance acquiring a BP cuff for daily BP readings  Patient will call [phone] for assistance in re-establishing My Chart access Patient will plan on a healthier diet   Plan:  Telephone follow up appointment with care management team member scheduled for:  09-02-2024  at 10:30 AM               "

## 2024-07-31 NOTE — Progress Notes (Signed)
 Received call from pt with complaint of ongoing generalized pain, requesting prescription for pain medication.  RN reviewed with MD who requested pt be referred to palliative care for pain management.  Referral placed, pt notified and verbalized understanding.

## 2024-08-20 ENCOUNTER — Ambulatory Visit (HOSPITAL_COMMUNITY)

## 2024-08-22 ENCOUNTER — Inpatient Hospital Stay: Admitting: Hematology and Oncology

## 2024-08-22 ENCOUNTER — Ambulatory Visit (HOSPITAL_COMMUNITY)

## 2024-08-23 ENCOUNTER — Ambulatory Visit (HOSPITAL_BASED_OUTPATIENT_CLINIC_OR_DEPARTMENT_OTHER): Admission: RE | Admit: 2024-08-23 | Source: Ambulatory Visit

## 2024-08-26 ENCOUNTER — Telehealth: Payer: Self-pay

## 2024-08-26 ENCOUNTER — Inpatient Hospital Stay: Admitting: Nurse Practitioner

## 2024-08-26 NOTE — Telephone Encounter (Signed)
 Attempted to call pt to discuss changes to her appt due to the weather, no answer and unable to LVM. RN sent a text message to pt phone using HIPAA compliant doximity to inform patient of appointment change.

## 2024-08-27 ENCOUNTER — Telehealth: Payer: Self-pay | Admitting: Nurse Practitioner

## 2024-08-29 ENCOUNTER — Inpatient Hospital Stay: Admitting: Hematology and Oncology

## 2024-08-30 ENCOUNTER — Ambulatory Visit (HOSPITAL_BASED_OUTPATIENT_CLINIC_OR_DEPARTMENT_OTHER): Admission: RE | Admit: 2024-08-30

## 2024-08-30 DIAGNOSIS — C50511 Malignant neoplasm of lower-outer quadrant of right female breast: Secondary | ICD-10-CM

## 2024-08-30 LAB — POCT I-STAT CREATININE: Creatinine, Ser: 0.7 mg/dL (ref 0.44–1.00)

## 2024-08-30 MED ORDER — IOHEXOL 300 MG/ML  SOLN
100.0000 mL | Freq: Once | INTRAMUSCULAR | Status: AC | PRN
  Administered 2024-08-30: 100 mL via INTRAVENOUS

## 2024-09-02 ENCOUNTER — Telehealth

## 2024-09-05 ENCOUNTER — Inpatient Hospital Stay: Admitting: Hematology and Oncology

## 2024-09-26 ENCOUNTER — Inpatient Hospital Stay

## 2024-10-15 ENCOUNTER — Inpatient Hospital Stay: Admitting: Adult Health

## 2024-10-15 ENCOUNTER — Inpatient Hospital Stay
